# Patient Record
Sex: Female | Born: 1980 | Race: Black or African American | Hispanic: No | Marital: Single | Smoking: Never smoker
Health system: Southern US, Community
[De-identification: ages and names within clinical notes are randomized; demographics above are authoritative.]

## PROBLEM LIST (undated history)

## (undated) DIAGNOSIS — K219 Gastro-esophageal reflux disease without esophagitis: Secondary | ICD-10-CM

## (undated) DIAGNOSIS — K08409 Partial loss of teeth, unspecified cause, unspecified class: Secondary | ICD-10-CM

## (undated) HISTORY — DX: Gastro-esophageal reflux disease without esophagitis: K21.9

## (undated) HISTORY — PX: IRRIGATION AND DEBRIDEMENT SEBACEOUS CYST: SHX5255

---

## 2007-01-29 ENCOUNTER — Ambulatory Visit: Payer: Self-pay

## 2007-05-19 ENCOUNTER — Ambulatory Visit: Payer: Self-pay | Admitting: Internal Medicine

## 2008-04-18 ENCOUNTER — Emergency Department: Payer: Self-pay | Admitting: Emergency Medicine

## 2008-08-24 ENCOUNTER — Emergency Department: Payer: Self-pay | Admitting: Emergency Medicine

## 2008-08-29 ENCOUNTER — Ambulatory Visit: Payer: Self-pay | Admitting: Internal Medicine

## 2011-10-30 ENCOUNTER — Emergency Department: Payer: Self-pay

## 2011-10-30 LAB — URINALYSIS, COMPLETE
Leukocyte Esterase: NEGATIVE
Ph: 5 (ref 4.5–8.0)
Protein: NEGATIVE
RBC,UR: 25 /HPF (ref 0–5)
Specific Gravity: 1.014 (ref 1.003–1.030)
Squamous Epithelial: 2
WBC UR: 1 /HPF (ref 0–5)

## 2011-10-30 LAB — PREGNANCY, URINE: Pregnancy Test, Urine: NEGATIVE m[IU]/mL

## 2012-01-14 ENCOUNTER — Emergency Department: Payer: Self-pay | Admitting: Emergency Medicine

## 2013-01-15 ENCOUNTER — Emergency Department: Payer: Self-pay | Admitting: Emergency Medicine

## 2013-01-15 LAB — URINALYSIS, COMPLETE
Bilirubin,UR: NEGATIVE
Glucose,UR: NEGATIVE mg/dL (ref 0–75)
Ph: 7 (ref 4.5–8.0)
Protein: 25
RBC,UR: 6 /HPF (ref 0–5)
Specific Gravity: 1.01 (ref 1.003–1.030)
Squamous Epithelial: 1

## 2013-01-15 LAB — WET PREP, GENITAL

## 2013-01-15 LAB — BASIC METABOLIC PANEL
Anion Gap: 5 — ABNORMAL LOW (ref 7–16)
BUN: 10 mg/dL (ref 7–18)
Calcium, Total: 8.6 mg/dL (ref 8.5–10.1)
Chloride: 103 mmol/L (ref 98–107)
Co2: 27 mmol/L (ref 21–32)
Creatinine: 0.78 mg/dL (ref 0.60–1.30)
Osmolality: 269 (ref 275–301)
Potassium: 3.5 mmol/L (ref 3.5–5.1)

## 2013-01-15 LAB — CBC
MCH: 29.4 pg (ref 26.0–34.0)
MCHC: 34.7 g/dL (ref 32.0–36.0)
MCV: 85 fL (ref 80–100)
RBC: 4.27 10*6/uL (ref 3.80–5.20)
WBC: 8.2 10*3/uL (ref 3.6–11.0)

## 2013-01-31 ENCOUNTER — Emergency Department: Payer: Self-pay | Admitting: Emergency Medicine

## 2013-01-31 LAB — COMPREHENSIVE METABOLIC PANEL
Albumin: 3.6 g/dL (ref 3.4–5.0)
BUN: 11 mg/dL (ref 7–18)
Bilirubin,Total: 0.4 mg/dL (ref 0.2–1.0)
Calcium, Total: 9.3 mg/dL (ref 8.5–10.1)
Chloride: 106 mmol/L (ref 98–107)
Co2: 23 mmol/L (ref 21–32)
EGFR (African American): >60
EGFR (Non-African Amer.): >60
Potassium: 3.7 mmol/L (ref 3.5–5.1)
SGOT(AST): 20 U/L (ref 15–37)
SGPT (ALT): 24 U/L (ref 12–78)
Total Protein: 7.4 g/dL (ref 6.4–8.2)

## 2013-01-31 LAB — URINALYSIS, COMPLETE
Nitrite: NEGATIVE
Ph: 5 (ref 4.5–8.0)
Protein: 25
RBC,UR: 443 /HPF (ref 0–5)
Squamous Epithelial: 9
WBC UR: 4 /HPF (ref 0–5)

## 2013-01-31 LAB — CBC
HGB: 12.9 g/dL (ref 12.0–16.0)
MCH: 28.8 pg (ref 26.0–34.0)
MCHC: 34 g/dL (ref 32.0–36.0)
MCV: 85 fL (ref 80–100)
Platelet: 257 10*3/uL (ref 150–440)
RBC: 4.49 10*6/uL (ref 3.80–5.20)
WBC: 9.4 10*3/uL (ref 3.6–11.0)

## 2013-02-01 ENCOUNTER — Emergency Department: Payer: Self-pay | Admitting: Emergency Medicine

## 2013-02-01 LAB — HCG, QUANTITATIVE, PREGNANCY: Beta Hcg, Quant.: 53483 m[IU]/mL — ABNORMAL HIGH

## 2013-02-01 LAB — CBC WITH DIFFERENTIAL/PLATELET
Basophil #: 0 10*3/uL (ref 0.0–0.1)
Basophil %: 0.4 %
Eosinophil #: 0 10*3/uL (ref 0.0–0.7)
Eosinophil %: 0.2 %
HCT: 37.1 % (ref 35.0–47.0)
HGB: 12.9 g/dL (ref 12.0–16.0)
Lymphocyte #: 1.8 10*3/uL (ref 1.0–3.6)
MCH: 29.2 pg (ref 26.0–34.0)
MCHC: 34.7 g/dL (ref 32.0–36.0)
Monocyte #: 0.6 x10 3/mm (ref 0.2–0.9)
Monocyte %: 6.9 %
Neutrophil %: 71.5 %
Platelet: 245 10*3/uL (ref 150–440)

## 2013-02-01 LAB — CBC
HCT: 35.8 % (ref 35.0–47.0)
HGB: 12.3 g/dL (ref 12.0–16.0)
MCV: 84 fL (ref 80–100)
Platelet: 236 10*3/uL (ref 150–440)
RBC: 4.24 10*6/uL (ref 3.80–5.20)

## 2013-02-03 LAB — PATHOLOGY REPORT

## 2013-02-10 ENCOUNTER — Observation Stay: Payer: Self-pay

## 2013-02-10 LAB — COMPREHENSIVE METABOLIC PANEL
Albumin: 3.7 g/dL (ref 3.4–5.0)
Alkaline Phosphatase: 69 U/L (ref 50–136)
Anion Gap: 9 (ref 7–16)
BUN: 11 mg/dL (ref 7–18)
Chloride: 100 mmol/L (ref 98–107)
Co2: 25 mmol/L (ref 21–32)
Creatinine: 0.79 mg/dL (ref 0.60–1.30)
EGFR (African American): >60
Glucose: 86 mg/dL (ref 65–99)
SGOT(AST): 16 U/L (ref 15–37)
SGPT (ALT): 30 U/L (ref 12–78)
Sodium: 134 mmol/L — ABNORMAL LOW (ref 136–145)
Total Protein: 7.5 g/dL (ref 6.4–8.2)

## 2013-07-04 ENCOUNTER — Observation Stay: Payer: Self-pay | Admitting: Obstetrics & Gynecology

## 2013-07-04 LAB — URINALYSIS, COMPLETE
BILIRUBIN, UR: NEGATIVE
GLUCOSE, UR: NEGATIVE mg/dL (ref 0–75)
LEUKOCYTE ESTERASE: NEGATIVE
NITRITE: NEGATIVE
PH: 6 (ref 4.5–8.0)
PROTEIN: NEGATIVE
RBC,UR: 45 /HPF (ref 0–5)
SPECIFIC GRAVITY: 1.016 (ref 1.003–1.030)
Squamous Epithelial: 1

## 2013-07-04 LAB — FETAL FIBRONECTIN
Appearance: NORMAL
Fetal Fibronectin: NEGATIVE

## 2013-07-11 ENCOUNTER — Inpatient Hospital Stay: Payer: Self-pay | Admitting: Obstetrics and Gynecology

## 2013-07-11 LAB — ALT: SGPT (ALT): 19 U/L (ref 12–78)

## 2013-07-11 LAB — LIPASE, BLOOD: Lipase: 449 U/L — ABNORMAL HIGH (ref 73–393)

## 2013-07-11 LAB — URINALYSIS, COMPLETE
BILIRUBIN, UR: NEGATIVE
BLOOD: NEGATIVE
GLUCOSE, UR: NEGATIVE mg/dL (ref 0–75)
LEUKOCYTE ESTERASE: NEGATIVE
Nitrite: NEGATIVE
Ph: 7 (ref 4.5–8.0)
Protein: 30
RBC,UR: 4 /HPF (ref 0–5)
Specific Gravity: 1.018 (ref 1.003–1.030)
Squamous Epithelial: 6
WBC UR: 9 /HPF (ref 0–5)

## 2013-07-11 LAB — WET PREP, GENITAL

## 2013-07-11 LAB — SGOT (AST)(ARMC): AST: 36 U/L (ref 15–37)

## 2013-07-11 LAB — ALKALINE PHOSPHATASE: ALK PHOS: 162 U/L — AB

## 2013-07-12 LAB — LIPASE, BLOOD: LIPASE: 120 U/L (ref 73–393)

## 2013-07-14 ENCOUNTER — Ambulatory Visit: Payer: Self-pay

## 2013-07-14 LAB — BETA STREP CULTURE(ARMC)

## 2013-07-19 ENCOUNTER — Encounter: Payer: Self-pay | Admitting: General Surgery

## 2013-08-04 ENCOUNTER — Inpatient Hospital Stay: Payer: Self-pay | Admitting: Obstetrics and Gynecology

## 2013-08-04 ENCOUNTER — Ambulatory Visit: Payer: Self-pay | Admitting: General Surgery

## 2013-08-04 LAB — CBC WITH DIFFERENTIAL/PLATELET
BASOS ABS: 0 10*3/uL (ref 0.0–0.1)
BASOS PCT: 0.2 %
EOS PCT: 0.2 %
Eosinophil #: 0 10*3/uL (ref 0.0–0.7)
HCT: 31.5 % — ABNORMAL LOW (ref 35.0–47.0)
HGB: 10.4 g/dL — ABNORMAL LOW (ref 12.0–16.0)
LYMPHS ABS: 1.3 10*3/uL (ref 1.0–3.6)
LYMPHS PCT: 14.3 %
MCH: 27.1 pg (ref 26.0–34.0)
MCHC: 33 g/dL (ref 32.0–36.0)
MCV: 82 fL (ref 80–100)
MONOS PCT: 4.7 %
Monocyte #: 0.4 x10 3/mm (ref 0.2–0.9)
Neutrophil #: 7.4 10*3/uL — ABNORMAL HIGH (ref 1.4–6.5)
Neutrophil %: 80.6 %
Platelet: 251 10*3/uL (ref 150–440)
RBC: 3.84 10*6/uL (ref 3.80–5.20)
RDW: 16.7 % — ABNORMAL HIGH (ref 11.5–14.5)
WBC: 9.1 10*3/uL (ref 3.6–11.0)

## 2013-08-05 LAB — GC/CHLAMYDIA PROBE AMP

## 2013-08-05 LAB — HEMATOCRIT: HCT: 29.5 % — ABNORMAL LOW (ref 35.0–47.0)

## 2013-09-01 ENCOUNTER — Ambulatory Visit (INDEPENDENT_AMBULATORY_CARE_PROVIDER_SITE_OTHER): Payer: Medicaid Other | Admitting: General Surgery

## 2013-09-01 ENCOUNTER — Encounter: Payer: Self-pay | Admitting: General Surgery

## 2013-09-01 VITALS — BP 142/72 | HR 78 | Resp 14 | Ht 69.0 in | Wt 167.0 lb

## 2013-09-01 DIAGNOSIS — K8020 Calculus of gallbladder without cholecystitis without obstruction: Secondary | ICD-10-CM

## 2013-09-01 NOTE — Progress Notes (Signed)
Patient ID: Paula Page, female   DOB: 18-Jun-1980, 33 y.o.   MRN: [id number]  Chief Complaint  Patient presents with  . Other    New Patient evaluation of gallbladder.     HPI Paula FossaLeslie Castor is a 33 y.o. female who presents for an evaluation of her gallbladder. She states this has been a problem for awhile but wasn't able to pin point exactly what it was. She was pregnant at the time all this began. She had an abdominal ultrasound done on 07/14/13. She complains of abdominal pain that radiates to the back. The pain is located in the right upper quadrant underneath her ribs. She also had nausea and vomiting. Greasy food,dairy products, and vegetables especially lettuce tend to bother her. She states she has had episodes of really bad indigestion that would last for approximately 2 hours. No diarrhea. The patient delivered her third child (344 year old daughter, 33 year old son, infant son) recently.  HPI  Past Medical History  Diagnosis Date  . GERD (gastroesophageal reflux disease)   . Anemia     Past Surgical History  Procedure Laterality Date  . Irrigation and debridement sebaceous cyst      No family history on file.  Social History History  Substance Use Topics  . Smoking status: Never Smoker   . Smokeless tobacco: Not on file  . Alcohol Use: No    Allergies  Allergen Reactions  . Diclegis [Doxylamine-Pyridoxine] Hives  . Penicillins Hives  . Sulfa Antibiotics Hives    Current Outpatient Prescriptions  Medication Sig Dispense Refill  . Iron-FA-B Cmp-C-Biot-Probiotic (FUSION PLUS PO) Take by mouth.      . pantoprazole (PROTONIX) 40 MG tablet Take 40 mg by mouth daily.       No current facility-administered medications for this visit.    Review of Systems Review of Systems  Constitutional: Negative.   Respiratory: Negative.   Cardiovascular: Negative.   Gastrointestinal: Positive for nausea, vomiting and abdominal pain.    Blood pressure 142/72, pulse 78, resp.  rate 14, height 5\' 9"  (1.753 m), weight 167 lb (75.751 kg), last menstrual period 09/01/2013.  Physical Exam Physical Exam  Constitutional: She is oriented to person, place, and time. She appears well-developed and well-nourished.  Neck: Neck supple. No thyromegaly present.  Cardiovascular: Normal rate, regular rhythm and normal heart sounds.   No murmur heard. Pulmonary/Chest: Effort normal and breath sounds normal.  Abdominal: Soft. Normal appearance and bowel sounds are normal. There is no hepatosplenomegaly. There is tenderness in the right upper quadrant and right lower quadrant. No hernia.  Lymphadenopathy:    She has no cervical adenopathy.  Neurological: She is alert and oriented to person, place, and time.  Skin: Skin is warm and dry.    Data Reviewed Abdominal ultrasound dated July 14, 2013 showed evidence of cholelithiasis without evidence of acute cholecystitis. Common bile duct measured 1.3 mm.  Assessment     Chronic cholecystitis and cholelithiasis.     Plan    The pros and cons elective cholecystectomy were reviewed. The risks of the procedure including those related to bleeding, infection, common bile duct injury and the possibility of an open procedure were discussed.      [facility]. She will pre admit by phone on 09/16/13. She is aware of date and all instructions.  Ref. MD: Sharen HonesGUTIERREZ, COLLEEN/ Dr. Corena PilgrimHarris     Nicki Furlan W Joselynn Amoroso 09/02/2013, 8:03 PM

## 2013-09-01 NOTE — Patient Instructions (Addendum)
Laparoscopic Cholecystectomy Laparoscopic cholecystectomy is surgery to remove the gallbladder. The gallbladder is located in the upper right part of the abdomen, behind the liver. It is a storage sac for bile produced in the liver. Bile aids in the digestion and absorption of fats. Cholecystectomy is often done for inflammation of the gallbladder (cholecystitis). This condition is usually caused by a buildup of gallstones (cholelithiasis) in your gallbladder. Gallstones can block the flow of bile, resulting in inflammation and pain. In severe cases, emergency surgery may be required. When emergency surgery is not required, you will have time to prepare for the procedure. Laparoscopic surgery is an alternative to open surgery. Laparoscopic surgery has a shorter recovery time. Your common bile duct may also need to be examined during the procedure. If stones are found in the common bile duct, they may be removed. LET [facility] PROVIDER KNOW ABOUT:  Any allergies you have.  All medicines you are taking, including vitamins, herbs, eye drops, creams, and over-the-counter medicines.  Previous problems you or members of your family have had with the use of anesthetics.  Any blood disorders you have.  Previous surgeries you have had.  Medical conditions you have. RISKS AND COMPLICATIONS Generally, this is a safe procedure. However, as with any procedure, complications can occur. Possible complications include:  Infection.  Damage to the common bile duct, nerves, arteries, veins, or other internal organs such as the stomach, liver, or intestines.  Bleeding.  A stone may remain in the common bile duct.  A bile leak from the cyst duct that is clipped when your gallbladder is removed.  The need to convert to open surgery, which requires a larger incision in the abdomen. This may be necessary if your surgeon thinks it is not safe to continue with a laparoscopic procedure. BEFORE THE  PROCEDURE  Ask your health care provider about changing or stopping any regular medicines. You will need to stop taking aspirin or blood thinners at least 5 days prior to surgery.  Do not eat or drink anything after midnight the night before surgery.  Let your health care provider know if you develop a cold or other infectious problem before surgery. PROCEDURE   You will be given medicine to make you sleep through the procedure (general anesthetic). A breathing tube will be placed in your mouth.  When you are asleep, your surgeon will make several small cuts (incisions) in your abdomen.  A thin, lighted tube with a tiny camera on the end (laparoscope) is inserted through one of the small incisions. The camera on the laparoscope sends a picture to a TV screen in the operating room. This gives the surgeon a good view inside your abdomen.  A gas will be pumped into your abdomen. This expands your abdomen so that the surgeon has more room to perform the surgery.  Other tools needed for the procedure are inserted through the other incisions. The gallbladder is removed through one of the incisions.  After the removal of your gallbladder, the incisions will be closed with stitches, staples, or skin glue. AFTER THE PROCEDURE  You will be taken to a recovery area where your progress will be checked often.  You may be allowed to go home the same day if your pain is controlled and you can tolerate liquids. Document Released: 04/29/2005 Document Revised: 02/17/2013 Document Reviewed: 12/09/2012 [facility] Patient Information 2014 [employer] [place].   Patient [facility]. She will pre admit by phone  on 09/16/13. She is aware of date and all instructions.

## 2013-09-02 ENCOUNTER — Other Ambulatory Visit: Payer: Self-pay | Admitting: General Surgery

## 2013-09-02 DIAGNOSIS — K8020 Calculus of gallbladder without cholecystitis without obstruction: Secondary | ICD-10-CM

## 2013-09-03 ENCOUNTER — Emergency Department: Payer: Self-pay | Admitting: Emergency Medicine

## 2013-09-03 LAB — URINALYSIS, COMPLETE
BILIRUBIN, UR: NEGATIVE
Bacteria: NONE SEEN
Glucose,UR: NEGATIVE mg/dL (ref 0–75)
Ketone: NEGATIVE
Leukocyte Esterase: NEGATIVE
Nitrite: NEGATIVE
Ph: 5 (ref 4.5–8.0)
Protein: NEGATIVE
RBC,UR: 7 /HPF (ref 0–5)
SPECIFIC GRAVITY: 1.019 (ref 1.003–1.030)
WBC UR: 1 /HPF (ref 0–5)

## 2013-09-03 LAB — COMPREHENSIVE METABOLIC PANEL
ALK PHOS: 105 U/L
Albumin: 3.7 g/dL (ref 3.4–5.0)
Anion Gap: 4 — ABNORMAL LOW (ref 7–16)
BUN: 11 mg/dL (ref 7–18)
Bilirubin,Total: 0.4 mg/dL (ref 0.2–1.0)
Calcium, Total: 8.8 mg/dL (ref 8.5–10.1)
Chloride: 110 mmol/L — ABNORMAL HIGH (ref 98–107)
Co2: 27 mmol/L (ref 21–32)
Creatinine: 0.93 mg/dL (ref 0.60–1.30)
EGFR (Non-African Amer.): >60
GLUCOSE: 107 mg/dL — AB (ref 65–99)
OSMOLALITY: 281 (ref 275–301)
Potassium: 3.6 mmol/L (ref 3.5–5.1)
SGOT(AST): 20 U/L (ref 15–37)
SGPT (ALT): 18 U/L (ref 12–78)
SODIUM: 141 mmol/L (ref 136–145)
TOTAL PROTEIN: 7.6 g/dL (ref 6.4–8.2)

## 2013-09-03 LAB — CBC WITH DIFFERENTIAL/PLATELET
BASOS PCT: 0.4 %
Basophil #: 0 10*3/uL (ref 0.0–0.1)
EOS ABS: 0 10*3/uL (ref 0.0–0.7)
Eosinophil %: 0.9 %
HCT: 40.6 % (ref 35.0–47.0)
HGB: 13 g/dL (ref 12.0–16.0)
LYMPHS ABS: 2 10*3/uL (ref 1.0–3.6)
LYMPHS PCT: 36.4 %
MCH: 26.8 pg (ref 26.0–34.0)
MCHC: 32.1 g/dL (ref 32.0–36.0)
MCV: 83 fL (ref 80–100)
MONOS PCT: 7.2 %
Monocyte #: 0.4 x10 3/mm (ref 0.2–0.9)
NEUTROS ABS: 3 10*3/uL (ref 1.4–6.5)
Neutrophil %: 55.1 %
Platelet: 236 10*3/uL (ref 150–440)
RBC: 4.86 10*6/uL (ref 3.80–5.20)
RDW: 19 % — AB (ref 11.5–14.5)
WBC: 5.4 10*3/uL (ref 3.6–11.0)

## 2013-09-03 LAB — LIPASE, BLOOD: Lipase: 227 U/L (ref 73–393)

## 2013-09-23 ENCOUNTER — Encounter: Payer: Self-pay | Admitting: General Surgery

## 2013-09-23 ENCOUNTER — Ambulatory Visit: Payer: Self-pay | Admitting: General Surgery

## 2013-09-23 DIAGNOSIS — K8010 Calculus of gallbladder with chronic cholecystitis without obstruction: Secondary | ICD-10-CM

## 2013-09-23 HISTORY — PX: CHOLECYSTECTOMY: SHX55

## 2013-09-24 LAB — PATHOLOGY REPORT

## 2013-09-27 ENCOUNTER — Encounter: Payer: Self-pay | Admitting: General Surgery

## 2013-09-30 ENCOUNTER — Ambulatory Visit (INDEPENDENT_AMBULATORY_CARE_PROVIDER_SITE_OTHER): Payer: Self-pay | Admitting: General Surgery

## 2013-09-30 ENCOUNTER — Encounter: Payer: Self-pay | Admitting: General Surgery

## 2013-09-30 VITALS — BP 112/74 | HR 72 | Resp 12 | Ht 69.0 in | Wt 169.0 lb

## 2013-09-30 DIAGNOSIS — K8020 Calculus of gallbladder without cholecystitis without obstruction: Secondary | ICD-10-CM

## 2013-09-30 NOTE — Patient Instructions (Signed)
Patient to return as needed. Proper lifting techniques reviewed. 

## 2013-09-30 NOTE — Progress Notes (Signed)
Patient ID: Paula Page, female   DOB: December 07, 1980, 33 y.o.   MRN: [id number]  Chief Complaint  Patient presents with  . Routine Post Op    gallbladder    HPI Paula Page is a 33 y.o. female here today for her post op gallbladder surgery .she states she is doing well just a little sore still and indigestion. The patient reports a moderate amount of pain post procedure as well as increasing indigestion. She did not obtain significant benefit from the use of a heating pad. She's make use of Paula Page for her indigestion with good relief. No change in bowel habits. It is only now that her appetite is returning to normal.  The patient reported discomfort when her child would lay on the upper abdomen to nurse or when she would stretch her arms over her head. She reported discomfort at the base of the xiphoid process just above the level of the epigastric incision. She was reassured that this will resolve in time. HPI  Past Medical History  Diagnosis Date  . GERD (gastroesophageal reflux disease)   . Anemia     Past Surgical History  Procedure Laterality Date  . Irrigation and debridement sebaceous cyst    . Cholecystectomy  09/23/13    No family history on file.  Social History History  Substance Use Topics  . Smoking status: Never Smoker   . Smokeless tobacco: Never Used  . Alcohol Use: No    Allergies  Allergen Reactions  . Diclegis [Doxylamine-Pyridoxine] Hives  . Penicillins Hives  . Sulfa Antibiotics Hives    Current Outpatient Prescriptions  Medication Sig Dispense Refill  . Iron-FA-B Cmp-C-Biot-Probiotic (FUSION PLUS PO) Take by mouth.      . pantoprazole (PROTONIX) 40 MG tablet Take 40 mg by mouth daily.       No current facility-administered medications for this visit.    Review of Systems Review of Systems  Constitutional: Negative.   Respiratory: Negative.   Cardiovascular: Negative.     Blood pressure 112/74, pulse 72, resp. rate 12, height 5\' 9"  (1.753 m),  weight 169 lb (76.658 kg), last menstrual period 09/01/2013.  Physical Exam Physical Exam  Constitutional: She is oriented to person, place, and time. She appears well-developed and well-nourished.  Eyes: Conjunctivae are normal.  Neck: Neck supple.  Cardiovascular: Normal rate, regular rhythm and normal heart sounds.   Pulmonary/Chest: Effort normal and breath sounds normal.  Abdominal: Soft. Normal appearance and bowel sounds are normal. There is no tenderness.  Port site looks clean and healing well.   Neurological: She is alert and oriented to person, place, and time.  Skin: Skin is warm and dry.    Data Reviewed Chronic cholecystitis and cholelithiasis   Assessment    Steady progress status post cholecystectomy.    Plan    The patient will increase her activities as tolerated. Followup ear will be on an as-needed basis.    PCP: No Pcp Per Patient    Earline MayotteJeffrey W Byrnett 09/30/2013, 1:55 PM

## 2014-03-14 ENCOUNTER — Encounter: Payer: Self-pay | Admitting: General Surgery

## 2014-09-03 NOTE — Op Note (Signed)
PATIENT NAME:  Paula CobbsBROWN, Paula Page MR#:  [id number] DATE OF BIRTH:  [birth year]  DATE OF PROCEDURE:  09/23/2013  PREOPERATIVE DIAGNOSIS: Chronic cholecystitis and cholelithiasis.   POSTOPERATIVE DIAGNOSIS: Chronis cholecystitis and cholelithiasis.  OPERATIVE PROCEDURE: Laparoscopic cholecystectomy with intraoperative cholangiograms.   SURGEON: Donnalee CurryJeffrey Meyah Corle, MD.   ANESTHESIA: General endotracheal under Dr. Noralyn Pickarroll.   ESTIMATED BLOOD LOSS: Less than 5 mL.   CLINICAL NOTE: This 34 year old woman has had episodic abdominal pain and ultrasound showed evidence of cholelithiasis. She was felt to be a candidate for elective cholecystectomy.   OPERATIVE NOTE: With the patient under adequate general endotracheal anesthesia, the abdomen was prepped with ChloraPrep and draped. In Trendelenburg position, a Veress needle was placed through a transumbilical incision. After assuring intra-abdominal location with the hanging drop test, the abdomen was insufflated with CO2 at 10 mmHg pressure. A 10 mm step port was expanded and inspection showed no evidence of injury from initial port placement. The patient was placed in reverse Trendelenburg position and rolled to the left. An 11 mm Xcel port was placed in the epigastrium and two 5 mm step ports placed in the right lateral abdominal wall. There were noted to be multiple fine adhesions between the omentum and the entire undersurface of the gallbladder. These were taken down with cautery dissection. The gallbladder was placed on cephalad traction and the neck of the gallbladder cleared. Cystic duct overlay the cystic artery and cystic duct lymph node. After clearing the cystic duct, a Kumar clamp was placed. Initial attempts at cholangiograms were unsuccessful and the catheter was repositioned. Using a total of 30 mL of one half strength Conray, cholangiograms were completed. There was a suggestion of a round filling defect in the proximal common hepatic duct and on  further dye instillation and rolling the patient to the right, this resolved suggesting this was an air bubble. There was free flow into the duodenum. Normal taper of the distal common bile duct. The cystic duct was doubly clipped as were both branches of the cystic artery adjacent to the gallbladder. The gallbladder was removed from the liver bed making use of hook cautery dissection. A small rent in the gallbladder released 2 small pearly white cholesterol stones. These were retrieved. The defect in the gallbladder was grasped and the remaining dissection completed. The gallbladder was delivered to an Endo Catch bag and then through the umbilical port site. After re-establishing pneumoperitoneum, inspection from the epigastric site showed no evidence of injury from initial port placement. The right upper quadrant was irrigated with lactated Ringer's solution. Good hemostasis was noted. Clips were appropriately placed. The abdomen was then desufflated and ports removed under direct vision. The fascia at the umbilicus and the epigastric site was approximated with a single 0 Vicryl suture. Skin incisions were closed with 4-0 Vicryl subcuticular sutures. Benzoin, Steri-Strips, Telfa and Tegaderm dressings were applied. The patient tolerated the procedure well and was taken to the recovery room in stable condition.   ____________________________ Paula MayotteJeffrey W. Davianna Deutschman, MD jwb:aw D: 09/23/2013 10:10:02 ET T: 09/23/2013 10:19:57 ET JOB#: [id number]  cc: Paula MayotteJeffrey W. Argel Pablo, MD, <Dictator> Paula CodeFozia Page. Khan, MD Paula Fiske Brion AlimentW Ceria Suminski MD ELECTRONICALLY SIGNED 09/23/2013 12:43

## 2014-09-20 NOTE — H&P (Signed)
L&D Evaluation:  History Expanded:  HPI 34 yo at 5333 6/[redacted] weeks EGA with some contractions and mild pains.  [facility].  No vaginal bleeding or ROM.  Good FM.   Patient's Medical History No Chronic Illness   Patient's Surgical History none   Medications Pre Natal Vitamins   Allergies PCN, Sulfa   Social History none   Family History Non-Contributory   ROS:  ROS All systems were reviewed.  HEENT, CNS, GI, GU, Respiratory, CV, Renal and Musculoskeletal systems were found to be normal.   Exam:  Vital Signs stable   General no apparent distress   Mental Status clear   Chest clear   Heart normal sinus rhythm   Abdomen gravid, non-tender   Estimated Fetal Weight Average for gestational age   Back no CVAT   Edema no edema   Pelvic no external lesions, cervix closed and thick   Mebranes Intact   FHT normal rate with no decels   Ucx irregular   Skin dry   Other fetal fibronectin NEG   Impression:  Impression Eval for Preterm Labor.  Cervix not dilated on multiple exams.   Plan:  Plan EFM/NST, monitor contractions and for cervical change   Comments Procardia, then Terbutaline to try to alleviate contractions. MSO4 once gv to help her rest. AFV.   Follow Up Appointment already scheduled   Electronic Signatures: Letitia LibraHarris, Travarus Trudo Paul (MD)  (Signed 22-Feb-15 13:43)  Authored: L&D Evaluation   Last Updated: 22-Feb-15 13:43 by Letitia LibraHarris, Sheylin Scharnhorst Paul (MD)

## 2014-09-20 NOTE — H&P (Signed)
L&D Evaluation:  History Expanded:  HPI 34 yo at 6838 1/[redacted] weeks gestational age.  Her pregnancy has been complicated by early admission for hyperemesis and Preterm Labor.  Now w LOF since 0800, mild contractions although she has been having reg mild contractions for weeks, a little worse today. [facility].  No vaginal bleeding.  Good FM. O-, received RhoGam, RI, VI, TDaP UTD, Group B Beta Strep. -.   Gravida 4   Term 2   PreTerm 0   Abortion 1   Living 2   Blood Type (Maternal) O negative   Group B Strep Results Maternal (Result >5wks must be treated as unknown) negative   Maternal HIV Negative   Maternal Syphilis Ab Nonreactive   Maternal Varicella Immune   Rubella Results (Maternal) immune   EDC 16-Aug-2013   Presents with leaking fluid   Patient's Medical History No Chronic Illness   Patient's Surgical History none   Medications Pre Natal Vitamins   Allergies PCN, Sulfa   Social History none   Family History Non-Contributory   ROS:  ROS All systems were reviewed.  HEENT, CNS, GI, GU, Respiratory, CV, Renal and Musculoskeletal systems were found to be normal., unless otherwise noted in HPI   Exam:  Vital Signs stable   General no apparent distress   Mental Status clear   Chest clear   Heart normal sinus rhythm   Abdomen gravid, tender with contractions   Estimated Fetal Weight Average for gestational age   Back no CVAT   Edema no edema   Pelvic no external lesions, 1/70/-2, may be bulging bag as well in spite of positivity of Spontaneous rupture of membranes   Mebranes Ruptured, + pool, - nitrazine, + fern   Description clear   FHT normal rate with no decels   FHT Description 140/mod var/+accels/no decels   Ucx regular, 4 q 10 min   Ucx Frequency 2 min   Skin dry   Impression:  Impression Eval for Preterm Labor.  Cervix not dilated on multiple exams.   Plan:  Plan EFM/NST, monitor contractions and  for cervical change   Comments spontaneous rupture of membranes so monitor for labor (since 0800).  Pitocin as needed.  Epidural discussed.   Electronic Signatures: Letitia LibraHarris, Robert Paul (MD)  (Signed 25-Mar-15 15:16)  Authored: L&D Evaluation   Last Updated: 25-Mar-15 15:16 by Letitia LibraHarris, Robert Paul (MD)

## 2014-09-20 NOTE — H&P (Signed)
L&D Evaluation:  History Expanded:  HPI 34 yo at 8234 6/[redacted] weeks gestational age.  Her pregnancy has been complicated by early admission for hyperemesis.  She presented one week ago with contractions, but no cervical change (she had a negative fFN). She was started on nifedipine 30mg  xl daily, which she is taking. She presents today with general abdominal and back complaints.  She ate lunch at 4pm and became nauseated and threw up on about 6pm.  She had epigastric pain and lower abdominal/suprapubic pain since then.  She has also had right sided pain and diffuse back pain that comes and goes.  She has had occasional nausea, but only had emesis one time.  She tried taking protonix and phenergan at home and her epigastric pain has improved. She now has lower abdominal pain/suprapubic pain.  She denies fevers. She has had urinary frequency and pressure.  She has had some clear vaginal discharge.  [facility]  No vaginal bleeding or ROM.  Good FM.  + contractions.   Gravida 4   Term 2   PreTerm 0   Abortion 1   Living 2   Blood Type (Maternal) O negative   Group B Strep Results Maternal (Result >5wks must be treated as unknown) unknown/result > 5 weeks ago   Maternal HIV Negative   Maternal Syphilis Ab Nonreactive   Maternal Varicella Immune   Rubella Results (Maternal) immune   [facility]   Patient's Medical History No Chronic Illness   Patient's Surgical History none   Medications Pre Natal Vitamins  nifedipine XL 30mg  once PO daily   Allergies PCN, Sulfa   Social History none   Family History Non-Contributory   ROS:  ROS All systems were reviewed.  HEENT, CNS, GI, GU, Respiratory, CV, Renal and Musculoskeletal systems were found to be normal., unless otherwise noted in HPI   Exam:  Vital Signs stable  T 97.9, P90-107, BP 94-112/56-69, RR16   General mild-mod distress   Mental Status clear   Chest clear   Heart normal sinus rhythm    Abdomen gravid, ttp with contractions at fundus, ttp in suprapubic area   Estimated Fetal Weight Average for gestational age   Back no CVAT   Edema no edema   Pelvic no external lesions, cervix closed and thick   Mebranes Intact   FHT normal rate with no decels   FHT Description 140/mod var/+accels/no decels   Ucx regular, 4 q 10 min   Skin dry   Impression:  Impression Eval for Preterm Labor.  Cervix not dilated on multiple exams.   Plan:  Plan EFM/NST, monitor contractions and for cervical change   Comments 1) abdominal pain: no evidnece of labor with closed cervix.  She is having contractions.  May give a single dose of terb to see if relieves pain if no other etiology found.  Possible gastroenteritis. Will give a dose of bicitra. Will give 1 liter of IV fluid. Will send LFTs and lipase. 2) contractions: Send U/A and wet prep.  Send GBS in case labors early.   Labs:  Lab Results:  Routine Micro:  01-Mar-15 22:30   Micro Text Report WET PREP   COMMENT                   MODERATE WHITE BLOOD CELLS SEEN   COMMENT                   NO CLUE CELLS SEEN  COMMENT                   NO TRICHOMONAS SEEN   COMMENT                   NO YEAST SEEN   COMMENT                   NO SPERMATOZOA SEEN   ANTIBIOTIC                       Comment 1. MODERATE WHITE BLOOD CELLS SEEN  Comment 2. NO CLUE CELLS SEEN  Comment 3. NO TRICHOMONAS SEEN  Comment 4. NO YEAST SEEN  Comment 5. NO SPERMATOZOA SEEN  Result(s) reported on 11 Jul 2013 at 10:50PM.  Routine UA:  01-Mar-15 21:41   Color (UA) Yellow  Clarity (UA) Cloudy  Glucose (UA) Negative  Bilirubin (UA) Negative  Ketones (UA) 2+  Specific Gravity (UA) 1.018  Blood (UA) Negative  pH (UA) 7.0  Protein (UA) 30 mg/dL  Nitrite (UA) Negative  Leukocyte Esterase (UA) Negative (Result(s) reported on 11 Jul 2013 at 10:30PM.)  RBC (UA) 4 /HPF  WBC (UA) 9 /HPF  Bacteria (UA) 1+  Epithelial Cells (UA) 6 /HPF  Mucous (UA) PRESENT  (Result(s) reported on 11 Jul 2013 at 10:30PM.)   Electronic Signatures: Conard NovakJackson, Deavion Dobbs D (MD)  (Signed 01-Mar-15 23:25)  Authored: L&D Evaluation, Labs   Last Updated: 01-Mar-15 23:25 by Conard NovakJackson, Kyshaun Barnette D (MD)

## 2014-12-20 ENCOUNTER — Emergency Department: Payer: PRIVATE HEALTH INSURANCE

## 2014-12-20 ENCOUNTER — Encounter: Payer: Self-pay | Admitting: Urgent Care

## 2014-12-20 ENCOUNTER — Emergency Department
Admission: EM | Admit: 2014-12-20 | Discharge: 2014-12-20 | Disposition: A | Discharge status: Home / Self Care | Payer: PRIVATE HEALTH INSURANCE | Attending: Emergency Medicine | Admitting: Emergency Medicine

## 2014-12-20 DIAGNOSIS — Z79899 Other long term (current) drug therapy: Secondary | ICD-10-CM | POA: Insufficient documentation

## 2014-12-20 DIAGNOSIS — Y9289 Other specified places as the place of occurrence of the external cause: Secondary | ICD-10-CM | POA: Diagnosis not present

## 2014-12-20 DIAGNOSIS — Y998 Other external cause status: Secondary | ICD-10-CM | POA: Diagnosis not present

## 2014-12-20 DIAGNOSIS — Y9389 Activity, other specified: Secondary | ICD-10-CM | POA: Diagnosis not present

## 2014-12-20 DIAGNOSIS — Z880 Allergy status to penicillin: Secondary | ICD-10-CM | POA: Diagnosis not present

## 2014-12-20 DIAGNOSIS — S99912A Unspecified injury of left ankle, initial encounter: Secondary | ICD-10-CM | POA: Diagnosis present

## 2014-12-20 DIAGNOSIS — W1839XA Other fall on same level, initial encounter: Secondary | ICD-10-CM | POA: Diagnosis not present

## 2014-12-20 DIAGNOSIS — S93402A Sprain of unspecified ligament of left ankle, initial encounter: Secondary | ICD-10-CM | POA: Diagnosis not present

## 2014-12-20 MED ORDER — OXYCODONE-ACETAMINOPHEN 5-325 MG PO TABS: 1.0000 | ORAL_TABLET | ORAL | Status: DC | PRN

## 2014-12-20 NOTE — ED Notes (Signed)
Patient presents with c/o LEFT ankle pain. Patient reports that foot fell asleep and she stood up with her child and stumbled causing an inversion injury of her ankle. (+) PMS noted; cap refill WNL; foot warm and dry.

## 2014-12-20 NOTE — ED Notes (Signed)

## 2014-12-20 NOTE — ED Provider Notes (Signed)
[facility] Emergency Department Provider Note ____________________________________________  Time seen: Approximately 9:40 PM  I have reviewed the triage vital signs and the nursing notes.   HISTORY  Chief Complaint Ankle Pain   HPI Paula Page is a 34 y.o. female presenting to the emergency room with complaint of left ankle pain.  She states she went to stand up but her foot was asleep and so she rolled her ankle while carrying her child.  She was more focused on not dropping her child and therefore was not focused on trying to stop her foot from rolling to the side.The pain is currently 8/10 and she is unable to bear weight.  The pain is located over the lateral malleolus and lateral foot.  She has full ROM though painful.  Cap refill normal and swelling is minimal.   Past Medical History  Diagnosis Date  . GERD (gastroesophageal reflux disease)     Patient Active Problem List   Diagnosis Date Noted  . Gallstones 09/02/2013    Past Surgical History  Procedure Laterality Date  . Irrigation and debridement sebaceous cyst    . Cholecystectomy  09/23/13    Current Outpatient Rx  Name  Route  Sig  Dispense  Refill  . Iron-FA-B Cmp-C-Biot-Probiotic (FUSION PLUS PO)   Oral   Take by mouth.         . oxyCODONE-acetaminophen (ROXICET) 5-325 MG per tablet   Oral   Take 1-2 tablets by mouth every 4 (four) hours as needed for severe pain.   15 tablet   0   . pantoprazole (PROTONIX) 40 MG tablet   Oral   Take 40 mg by mouth daily.           Allergies Diclegis; Penicillins; and Sulfa antibiotics  No family history on file.  Social History History  Substance Use Topics  . Smoking status: Never Smoker   . Smokeless tobacco: Never Used  . Alcohol Use: No    Review of Systems Constitutional: No fever/chills Cardiovascular: Denies chest pain. Respiratory: Denies shortness of breath. Musculoskeletal: Positive for left ankle pain. Skin:  Negative for rash. Neurological: Negative for headaches, focal weakness or numbness.  10-point ROS otherwise negative.  ____________________________________________   PHYSICAL EXAM:  VITAL SIGNS: ED Triage Vitals  Enc Vitals Group     BP 12/20/14 2112 121/72 mmHg     Pulse Rate 12/20/14 2112 76     Resp 12/20/14 2112 16     Temp 12/20/14 2112 98.4 F (36.9 C)     Temp Source 12/20/14 2112 Oral     SpO2 12/20/14 2112 100 %     Weight 12/20/14 2112 165 lb (74.844 kg)     Height 12/20/14 2112  (1.753 m)     Head Cir --      Peak Flow --      Pain Score 12/20/14 2113 8     Pain Loc --      Pain Edu? --      Excl. in GC? --     Constitutional: Alert and oriented. Well appearing and in no acute distress. Cardiovascular: Normal rate, regular rhythm. Grossly normal heart sounds.  Good peripheral circulation. Respiratory: Normal respiratory effort.  No retractions. Lungs CTAB. Musculoskeletal: Tenderness over the left lateral malleolus and lateral 5th metatarsal. Full ROM noted with discomfort. Unable to bear weight. Neurologic:  Normal speech and language. No gross focal neurologic deficits are appreciated. No gait instability. Skin:  Skin is warm,  dry and intact. No rash noted. Psychiatric: Mood and affect are normal. Speech and behavior are normal.  ____________________________________________   LABS (all labs ordered are listed, but only abnormal results are displayed)  Labs Reviewed - No data to display ____________________________________________  RADIOLOGY  DG ankle complete left EXAM: LEFT ANKLE COMPLETE - 3+ VIEW  COMPARISON: None.  FINDINGS: Three views of left ankle submitted. No acute fracture or subluxation. Ankle mortise is preserved.  IMPRESSION: Negative ____________________________________________   PROCEDURES  Procedure(s) performed: None  Critical Care performed: No  ____________________________________________   INITIAL  IMPRESSION / ASSESSMENT AND PLAN / ED COURSE  Pertinent labs & imaging results that were available during my care of the patient were reviewed by me and considered in my medical decision making (see chart for details).  Patient presents to the emergency room with left ankle pain after inversion injury to the ankle.  Radiographs of the left ankle are negative.  Ace wrap and ankle splint applied for support.  Instructions on ankle sprain care discussed. Percocet rx given for pain.  Follow up with Ortho if symptoms do not improve. ____________________________________________   FINAL CLINICAL IMPRESSION(S) / ED DIAGNOSES  Final diagnoses:  Left ankle sprain, initial encounter     Evangeline Dakin, PA-C 12/21/14 0004  Sharyn Creamer, MD 12/23/14 1550

## 2014-12-20 NOTE — Discharge Instructions (Signed)

## 2014-12-20 NOTE — ED Notes (Addendum)
Patient present to ED with left sided ankle pain, reports fell on ankle and states "it twisted inward" at 7:40 pm. Reports she was holding her baby when she fell. Patient reports (+) sensation and movement, reports feels tingling sensation and numbness in foot. Good pedal pulse and capillary refill. Patient unable to place weight on foot. No obvious deformity or swelling noted. Patient alert and oriented x 4, respirations even and unlabored.

## 2014-12-30 ENCOUNTER — Ambulatory Visit
Admission: RE | Admit: 2014-12-30 | Discharge: 2014-12-30 | Disposition: A | Discharge status: Home / Self Care | Payer: PRIVATE HEALTH INSURANCE | Source: Ambulatory Visit | Attending: Physician Assistant | Admitting: Physician Assistant

## 2014-12-30 ENCOUNTER — Other Ambulatory Visit: Payer: Self-pay | Admitting: Physician Assistant

## 2014-12-30 DIAGNOSIS — S99922A Unspecified injury of left foot, initial encounter: Secondary | ICD-10-CM

## 2014-12-30 DIAGNOSIS — M79672 Pain in left foot: Secondary | ICD-10-CM | POA: Diagnosis not present

## 2015-10-05 ENCOUNTER — Encounter: Payer: Self-pay | Admitting: Physician Assistant

## 2015-10-05 ENCOUNTER — Ambulatory Visit: Payer: Self-pay | Admitting: Physician Assistant

## 2015-10-05 VITALS — BP 119/70 | HR 80 | Temp 98.7°F

## 2015-10-05 DIAGNOSIS — R1010 Upper abdominal pain, unspecified: Secondary | ICD-10-CM

## 2015-10-05 LAB — POCT URINALYSIS DIPSTICK
BILIRUBIN UA: NEGATIVE
GLUCOSE UA: NEGATIVE
KETONES UA: NEGATIVE
LEUKOCYTES UA: NEGATIVE
Nitrite, UA: NEGATIVE
PH UA: 6.5
Protein, UA: NEGATIVE
Spec Grav, UA: 1.02
Urobilinogen, UA: 1

## 2015-10-05 LAB — POCT URINE PREGNANCY: Preg Test, Ur: NEGATIVE

## 2015-10-05 MED ORDER — PROMETHAZINE HCL 25 MG PO TABS: 25.0000 mg | ORAL_TABLET | Freq: Three times a day (TID) | ORAL | Status: DC | PRN

## 2015-10-05 MED ORDER — IBUPROFEN 800 MG PO TABS: 800.0000 mg | ORAL_TABLET | Freq: Three times a day (TID) | ORAL | Status: DC | PRN

## 2015-10-05 NOTE — Addendum Note (Signed)
Addended by: Catha BrowEACON, MONIQUE T on: 10/05/2015 02:31 PM   Modules accepted: Orders

## 2015-10-05 NOTE — Progress Notes (Signed)
S: c/o midepigastric pain, hx of same since she had her gallbladder out, if she eats something that she doesn't normally eat it gets worse, no fever/chills, + nausea and r sided back pain, no v/d, sx for a few days, feels a little better today than she did yesterday  O: vitals wnl, nad, lungs c t a, cv rrr, no cva tenderness, abd soft mildly tender in midepigastric area, bs normal all 4 quads, n/v intact, ua w 2+ blood  A: abdominal pain  P: phenergan, ibuprofen, f/u with GI if sx return, go to ER if worsening

## 2015-10-06 LAB — COMPREHENSIVE METABOLIC PANEL
ALK PHOS: 66 IU/L (ref 39–117)
ALT: 11 IU/L (ref 0–32)
AST: 16 IU/L (ref 0–40)
Albumin/Globulin Ratio: 1.5 (ref 1.2–2.2)
Albumin: 4.1 g/dL (ref 3.5–5.5)
BILIRUBIN TOTAL: 0.4 mg/dL (ref 0.0–1.2)
BUN/Creatinine Ratio: 14 (ref 9–23)
BUN: 10 mg/dL (ref 6–20)
CHLORIDE: 99 mmol/L (ref 96–106)
CO2: 23 mmol/L (ref 18–29)
Calcium: 8.5 mg/dL — ABNORMAL LOW (ref 8.7–10.2)
Creatinine, Ser: 0.71 mg/dL (ref 0.57–1.00)
GFR calc Af Amer: 128 mL/min/{1.73_m2} (ref >59)
GFR calc non Af Amer: 111 mL/min/{1.73_m2} (ref >59)
Globulin, Total: 2.7 g/dL (ref 1.5–4.5)
Glucose: 90 mg/dL (ref 65–99)
Potassium: 3.7 mmol/L (ref 3.5–5.2)
Sodium: 139 mmol/L (ref 134–144)
TOTAL PROTEIN: 6.8 g/dL (ref 6.0–8.5)

## 2015-10-06 LAB — LIPASE: LIPASE: 17 U/L (ref 0–59)

## 2015-10-15 ENCOUNTER — Emergency Department: Payer: Managed Care, Other (non HMO)

## 2015-10-15 ENCOUNTER — Emergency Department
Admission: EM | Admit: 2015-10-15 | Discharge: 2015-10-15 | Disposition: A | Discharge status: Home / Self Care | Payer: Managed Care, Other (non HMO) | Attending: Emergency Medicine | Admitting: Emergency Medicine

## 2015-10-15 ENCOUNTER — Encounter: Payer: Self-pay | Admitting: Emergency Medicine

## 2015-10-15 DIAGNOSIS — R1011 Right upper quadrant pain: Secondary | ICD-10-CM | POA: Diagnosis present

## 2015-10-15 DIAGNOSIS — N390 Urinary tract infection, site not specified: Secondary | ICD-10-CM | POA: Insufficient documentation

## 2015-10-15 DIAGNOSIS — R109 Unspecified abdominal pain: Secondary | ICD-10-CM

## 2015-10-15 LAB — COMPREHENSIVE METABOLIC PANEL
ALK PHOS: 55 U/L (ref 38–126)
ALT: 16 U/L (ref 14–54)
AST: 22 U/L (ref 15–41)
Albumin: 4.1 g/dL (ref 3.5–5.0)
Anion gap: 6 (ref 5–15)
BUN: 15 mg/dL (ref 6–20)
CO2: 25 mmol/L (ref 22–32)
CREATININE: 0.88 mg/dL (ref 0.44–1.00)
Calcium: 8.8 mg/dL — ABNORMAL LOW (ref 8.9–10.3)
Chloride: 108 mmol/L (ref 101–111)
GFR calc Af Amer: >60 mL/min (ref >60)
GFR calc non Af Amer: >60 mL/min (ref >60)
Glucose, Bld: 91 mg/dL (ref 65–99)
Potassium: 3.9 mmol/L (ref 3.5–5.1)
Sodium: 139 mmol/L (ref 135–145)
Total Bilirubin: 0.2 mg/dL — ABNORMAL LOW (ref 0.3–1.2)
Total Protein: 7.1 g/dL (ref 6.5–8.1)

## 2015-10-15 LAB — URINALYSIS COMPLETE WITH MICROSCOPIC (ARMC ONLY)
BILIRUBIN URINE: NEGATIVE
Glucose, UA: NEGATIVE mg/dL
KETONES UR: NEGATIVE mg/dL
Nitrite: NEGATIVE
PH: 5 (ref 5.0–8.0)
Protein, ur: 100 mg/dL — AB
Specific Gravity, Urine: 1.027 (ref 1.005–1.030)

## 2015-10-15 LAB — LIPASE, BLOOD: Lipase: 27 U/L (ref 11–51)

## 2015-10-15 LAB — CBC
HCT: 38.2 % (ref 35.0–47.0)
Hemoglobin: 13.2 g/dL (ref 12.0–16.0)
MCH: 28.8 pg (ref 26.0–34.0)
MCHC: 34.5 g/dL (ref 32.0–36.0)
MCV: 83.4 fL (ref 80.0–100.0)
PLATELETS: 251 10*3/uL (ref 150–440)
RBC: 4.58 MIL/uL (ref 3.80–5.20)
RDW: 14.8 % — ABNORMAL HIGH (ref 11.5–14.5)
WBC: 9.2 10*3/uL (ref 3.6–11.0)

## 2015-10-15 LAB — TROPONIN I: Troponin I: <0.03 ng/mL (ref <0.031)

## 2015-10-15 LAB — POCT PREGNANCY, URINE: Preg Test, Ur: NEGATIVE

## 2015-10-15 MED ORDER — PHENAZOPYRIDINE HCL 200 MG PO TABS: 200.0000 mg | ORAL_TABLET | Freq: Three times a day (TID) | ORAL | Status: DC | PRN

## 2015-10-15 MED ORDER — CIPROFLOXACIN HCL 500 MG PO TABS: 500.0000 mg | ORAL_TABLET | Freq: Two times a day (BID) | ORAL | Status: AC

## 2015-10-15 MED ORDER — PHENAZOPYRIDINE HCL 200 MG PO TABS
200.0000 mg | ORAL_TABLET | Freq: Once | ORAL | Status: AC
  Administered 2015-10-15: 200 mg via ORAL
  Filled 2015-10-15: qty 1

## 2015-10-15 MED ORDER — IBUPROFEN 800 MG PO TABS: 800.0000 mg | ORAL_TABLET | Freq: Three times a day (TID) | ORAL | Status: DC | PRN

## 2015-10-15 MED ORDER — CIPROFLOXACIN HCL 500 MG PO TABS
500.0000 mg | ORAL_TABLET | Freq: Once | ORAL | Status: AC
  Administered 2015-10-15: 500 mg via ORAL
  Filled 2015-10-15: qty 1

## 2015-10-15 NOTE — Discharge Instructions (Signed)
Urinary Tract Infection Urinary tract infections (UTIs) can develop anywhere along your urinary tract. Your urinary tract is your body's drainage system for removing wastes and extra water. Your urinary tract includes two kidneys, two ureters, a bladder, and a urethra. Your kidneys are a pair of bean-shaped organs. Each kidney is about the size of your fist. They are located below your ribs, one on each side of your spine. CAUSES Infections are caused by microbes, which are microscopic organisms, including fungi, viruses, and bacteria. These organisms are so small that they can only be seen through a microscope. Bacteria are the microbes that most commonly cause UTIs. SYMPTOMS  Symptoms of UTIs may vary by age and gender of the patient and by the location of the infection. Symptoms in young women typically include a frequent and intense urge to urinate and a painful, burning feeling in the bladder or urethra during urination. Older women and men are more likely to be tired, shaky, and weak and have muscle aches and abdominal pain. A fever may mean the infection is in your kidneys. Other symptoms of a kidney infection include pain in your back or sides below the ribs, nausea, and vomiting. DIAGNOSIS To diagnose a UTI, your caregiver will ask you about your symptoms. Your caregiver will also ask you to provide a urine sample. The urine sample will be tested for bacteria and white blood cells. White blood cells are made by your body to help fight infection. TREATMENT  Typically, UTIs can be treated with medication. Because most UTIs are caused by a bacterial infection, they usually can be treated with the use of antibiotics. The choice of antibiotic and length of treatment depend on your symptoms and the type of bacteria causing your infection. HOME CARE INSTRUCTIONS  If you were prescribed antibiotics, take them exactly as your caregiver instructs you. Finish the medication even if you feel better after  you have only taken some of the medication.  Drink enough water and fluids to keep your urine clear or pale yellow.  Avoid caffeine, tea, and carbonated beverages. They tend to irritate your bladder.  Empty your bladder often. Avoid holding urine for long periods of time.  Empty your bladder before and after sexual intercourse.  After a bowel movement, women should cleanse from front to back. Use each tissue only once. SEEK MEDICAL CARE IF:   You have back pain.  You develop a fever.  Your symptoms do not begin to resolve within 3 days. SEEK IMMEDIATE MEDICAL CARE IF:   You have severe back pain or lower abdominal pain.  You develop chills.  You have nausea or vomiting.  You have continued burning or discomfort with urination. MAKE SURE YOU:   Understand these instructions.  Will watch your condition.  Will get help right away if you are not doing well or get worse.   This information is not intended to replace advice given to you by your health care provider. Make sure you discuss any questions you have with your health care provider.   Document Released: 02/06/2005 Document Revised: 01/18/2015 Document Reviewed: 06/07/2011 Elsevier Interactive Patient Education 2016 Elsevier Inc[employer]  Flank Pain Flank pain refers to pain that is located on the side of the body between the upper abdomen and the back. The pain may occur over a short period of time (acute) or may be long-term or reoccurring (chronic). It may be mild or severe. Flank pain can be caused by many things. CAUSES  Some of the  more common causes of flank pain include:  Muscle strains.   Muscle spasms.   A disease of your spine (vertebral disk disease).   A lung infection (pneumonia).   Fluid around your lungs (pulmonary edema).   A kidney infection.   Kidney stones.   A very painful skin rash caused by the chickenpox virus (shingles).   Gallbladder disease.  HOME CARE INSTRUCTIONS  Home  care will depend on the cause of your pain. In general,  Rest as directed by your caregiver.  Drink enough fluids to keep your urine clear or pale yellow.  Only take over-the-counter or prescription medicines as directed by your caregiver. Some medicines may help relieve the pain.  Tell your caregiver about any changes in your pain.  Follow up with your caregiver as directed. SEEK IMMEDIATE MEDICAL CARE IF:   Your pain is not controlled with medicine.   You have new or worsening symptoms.  Your pain increases.   You have abdominal pain.   You have shortness of breath.   You have persistent nausea or vomiting.   You have swelling in your abdomen.   You feel faint or pass out.   You have blood in your urine.  You have a fever or persistent symptoms for more than 2-3 days.  You have a fever and your symptoms suddenly get worse. MAKE SURE YOU:   Understand these instructions.  Will watch your condition.  Will get help right away if you are not doing well or get worse.   This information is not intended to replace advice given to you by your health care provider. Make sure you discuss any questions you have with your health care provider.   Document Released: 06/20/2005 Document Revised: 01/22/2012 Document Reviewed: 12/12/2011 Elsevier Interactive Patient Education [employer]

## 2015-10-15 NOTE — ED Notes (Signed)
POCT RESULTS WERE NEGATIVE 

## 2015-10-15 NOTE — ED Notes (Signed)
Pt from lobby to room 18 - blood and urine already resulted. Pt has c/o dysuria. Sitting comfortably in bed awaiting dr.

## 2015-10-15 NOTE — ED Notes (Signed)
Pt reports 2 days with dysuria, hematuria and urinary frequency; pt also adds intermittent epigastric pain for 1 year, that radiates through to her back; was seen [facility] ([place] the [place]) and was told to follow up with specialist; they have not called her yet with appointment information; since she's here with hematuria she'd like to evaluated for this as well; cholecystectomy 09/23/2013

## 2015-10-15 NOTE — ED Provider Notes (Signed)
[facility] Emergency Department Provider Note        Time seen: ----------------------------------------- 9:38 PM on 10/15/2015 -----------------------------------------    I have reviewed the triage vital signs and the nursing notes.   HISTORY  Chief Complaint Abdominal Pain; Urinary Frequency; and Hematuria    HPI Paula Page is a 35 y.o. female who presents to ER for dysuria, hematuria and urinary frequency. Patient reports intermittent right upper quadrant and epigastric pain that radiates into her back for the last year. Patient had these symptoms prior to cholecystectomy but the symptoms are no better. She denies fevers chills or other complaints. She does have some right-sided back pain currently.   Past Medical History  Diagnosis Date  . GERD (gastroesophageal reflux disease)     Patient Active Problem List   Diagnosis Date Noted  . Gallstones 09/02/2013    Past Surgical History  Procedure Laterality Date  . Irrigation and debridement sebaceous cyst    . Cholecystectomy  09/23/13    Allergies Diclegis; Penicillins; and Sulfa antibiotics  Social History Social History  Substance Use Topics  . Smoking status: Never Smoker   . Smokeless tobacco: Never Used  . Alcohol Use: No    Review of Systems Constitutional: Negative for fever. Cardiovascular: Negative for chest pain. Respiratory: Negative for shortness of breath. Gastrointestinal: Positive for abdominal pain Genitourinary: Positive for dysuria, polyuria, hematuria Musculoskeletal: Positive for right-sided low back pain Skin: Negative for rash. Neurological: Negative for headaches, focal weakness or numbness.  10-point ROS otherwise negative.  ____________________________________________   PHYSICAL EXAM:  VITAL SIGNS: ED Triage Vitals  Enc Vitals Group     BP 10/15/15 2046 122/76 mmHg     Pulse Rate 10/15/15 2046 70     Resp 10/15/15 2046 18     Temp 10/15/15  2046 98.5 F (36.9 C)     Temp Source 10/15/15 2046 Oral     SpO2 10/15/15 2046 100 %     Weight 10/15/15 2046 164 lb (74.39 kg)     Height 10/15/15 2046 5\' 9"  (1.753 m)     Head Cir --      Peak Flow --      Pain Score 10/15/15 2047 7     Pain Loc --      Pain Edu? --      Excl. in GC? --    Constitutional: Alert and oriented. Well appearing and in no distress. Eyes: Conjunctivae are normal. PERRL. Normal extraocular movements. ENT   Head: Normocephalic and atraumatic.   Nose: No congestion/rhinnorhea.   Mouth/Throat: Mucous membranes are moist.   Neck: No stridor. Cardiovascular: Normal rate, regular rhythm. No murmurs, rubs, or gallops. Respiratory: Normal respiratory effort without tachypnea nor retractions. Breath sounds are clear and equal bilaterally. No wheezes/rales/rhonchi. Gastrointestinal: Soft and nontender. Normal bowel sounds, possible right CVA tenderness Musculoskeletal: Nontender with normal range of motion in all extremities. No lower extremity tenderness nor edema. Neurologic:  Normal speech and language. No gross focal neurologic deficits are appreciated.  Skin:  Skin is warm, dry and intact. No rash noted. Psychiatric: Mood and affect are normal. Speech and behavior are normal.  ____________________________________________  ED COURSE:  Pertinent labs & imaging results that were available during my care of the patient were reviewed by me and considered in my medical decision making (see chart for details). Patient is in no acute distress, we will get basic labs, urinalysis and consider further imaging depending on the urine results. ____________________________________________  LABS (pertinent positives/negatives)  Labs Reviewed  COMPREHENSIVE METABOLIC PANEL - Abnormal; Notable for the following:    Calcium 8.8 (*)    Total Bilirubin 0.2 (*)    All other components within normal limits  CBC - Abnormal; Notable for the following:    RDW  14.8 (*)    All other components within normal limits  URINALYSIS COMPLETEWITH MICROSCOPIC ([facility] ONLY) - Abnormal; Notable for the following:    Color, Urine YELLOW (*)    APPearance CLOUDY (*)    Hgb urine dipstick 3+ (*)    Protein, ur 100 (*)    Leukocytes, UA 3+ (*)    Bacteria, UA RARE (*)    Squamous Epithelial / LPF 0-5 (*)    All other components within normal limits  LIPASE, BLOOD  TROPONIN I  POC URINE PREG, ED  POCT PREGNANCY, URINE    RADIOLOGY Images were viewed by me  CT renal protocol IMPRESSION: No acute finding. No hydronephrosis or nephrolithiasis.  ____________________________________________  FINAL ASSESSMENT AND PLAN  Flank pain, cystitis  Plan: Patient with labs and imaging as dictated above. Flank pain is likely secondary to scar tissue from her cholecystectomy. No other etiology is identified. She does have a bad UTI, started on Cipro and Pyridium. She was discharged on similar, she stable for outpatient follow-up.   Emily Filbert, MD   Note: This dictation was prepared with Dragon dictation. Any transcriptional errors that result from this process are unintentional   Emily Filbert, MD 10/15/15 2245

## 2016-03-26 ENCOUNTER — Ambulatory Visit (INDEPENDENT_AMBULATORY_CARE_PROVIDER_SITE_OTHER): Payer: Managed Care, Other (non HMO) | Admitting: Internal Medicine

## 2016-03-26 ENCOUNTER — Encounter: Payer: Self-pay | Admitting: Internal Medicine

## 2016-03-26 VITALS — BP 125/82 | HR 94 | Temp 98.9°F | Ht 69.0 in | Wt 175.0 lb

## 2016-03-26 DIAGNOSIS — Z23 Encounter for immunization: Secondary | ICD-10-CM

## 2016-03-26 DIAGNOSIS — M791 Myalgia, unspecified site: Secondary | ICD-10-CM | POA: Insufficient documentation

## 2016-03-26 LAB — SEDIMENTATION RATE: SED RATE: 17 mm/h (ref 0–20)

## 2016-03-26 LAB — C-REACTIVE PROTEIN: CRP: 0.1 mg/dL — AB (ref 0.5–20.0)

## 2016-03-26 LAB — CK: CK TOTAL: 107 U/L (ref 7–177)

## 2016-03-26 LAB — T4, FREE: FREE T4: 0.74 ng/dL (ref 0.60–1.60)

## 2016-03-26 LAB — VITAMIN B12: Vitamin B-12: 629 pg/mL (ref 211–911)

## 2016-03-26 NOTE — Addendum Note (Signed)
Addended by: Eual FinesBRIDGES, Keayra Graham P on: 03/26/2016 01:11 PM   Modules accepted: Orders

## 2016-03-26 NOTE — Assessment & Plan Note (Signed)
Recurrent spells that are debilitating but short lived Picture not really consistent with fibromyalgia--but no other obvious etiology No systemic symptoms or decline over the almost 10 years since they started She is concerned about autoimmune condition--but no arthritis, synovitis, etc Will check some labs Discussed trial of symptomatic Rx (like muscle relaxer)--she prefers to hold off

## 2016-03-26 NOTE — Progress Notes (Signed)
Subjective:    Patient ID: Paula Page, female    DOB: 03-01-1981, 35 y.o.   MRN: [id number]  HPI Here to establish care Also due to having some pain issues  For years, she will get "flare ups" that can be debilitating Widespread pain--- dull and aching in back, prickly in feet Hands can be stiff and swell some All these seem to happen at the same time Has missed work with some flares--usually up to 3 days (but hasn't missed work lately) Last flare in left hip just recently Gets [facility] in shifts (15 minute breaks at work) Ibuprofen doesn't seem to help this She does try to stretch, heat and ice and chiropractic  No fevers with this--- but has felt like "this could be the flu" No preceding illness before these symptoms started-- ~2008 No chest pain or cough No GI symptoms with this--but has had pain since gallbladder removed Is seeing chiropractor about this--trying to get adjustments to help scar tissue (deep abdominal massage does seem to help)  Some concern due to mom's history (I used to see her --[employer]) Neurologic symptoms like stroke/TIA--but defied diagnosis  Current Outpatient Prescriptions on File Prior to Visit  Medication Sig Dispense Refill  . ibuprofen (ADVIL,MOTRIN) 800 MG tablet Take 1 tablet (800 mg total) by mouth every 8 (eight) hours as needed. 30 tablet 0   No current facility-administered medications on file prior to visit.     Allergies  Allergen Reactions  . Diclegis [Doxylamine-Pyridoxine] Hives  . Penicillins Hives  . Sulfa Antibiotics Hives    Past Medical History:  Diagnosis Date  . GERD (gastroesophageal reflux disease)    during pregnancy and before chole    Past Surgical History:  Procedure Laterality Date  . CHOLECYSTECTOMY  09/23/13  . IRRIGATION AND DEBRIDEMENT SEBACEOUS CYST      Family History  Problem Relation Age of Onset  . Stroke Mother   . Hypertension Mother   .  Hyperlipidemia Father   . Diabetes Paternal Uncle   . Diabetes Paternal Grandmother   . Cancer Paternal Grandfather [id number]    stomach cancer  . Alcohol abuse Paternal Grandfather     Social History   Social History  . Marital status: Single    Spouse name: N/A  . Number of children: 3  . Years of education: N/A   Occupational History  . 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 Smoking status: Never Smoker  . Smokeless tobacco: Never Used  . Alcohol use No  . Drug use: No  . Sexual activity: Not on file   Other Topics Concern  . Not on file   Social History Narrative   Single   [id number], 35 year old children   2 different fathers --both involved in care   Review of Systems  Constitutional: Positive for unexpected weight change.       Weight is up--relates to her age. Feels she needs to lose 10-15# Hard to exercise due to flares Wears seat belt  HENT: Negative for dental problem.        Keeps up with dentist  Eyes: Negative for visual disturbance.       No diplopia or unilateral vision loss--but does have problems with night vision  Respiratory: Negative for cough, chest tightness and shortness of breath.   Cardiovascular: Negative for chest pain and palpitations.  Leg swelling in the past--not in the past 2 years  Gastrointestinal: Negative for abdominal pain, blood in stool, nausea and vomiting.  Endocrine: Negative for polydipsia and polyuria.  Genitourinary: Positive for hematuria. Negative for dysuria.       Traces of blood in urine since age 35  Musculoskeletal: Positive for arthralgias and myalgias.       Mostly muscle pain but some joint pain  Skin: Negative for rash.  Allergic/Immunologic: Positive for environmental allergies. Negative for immunocompromised state.       Rarely uses meds  Neurological: Negative for dizziness, syncope, light-headedness and headaches.  Hematological: Negative for adenopathy. Bruises/bleeds  easily.  Psychiatric/Behavioral: The patient is not nervous/anxious.        Some sleep problems if she is in pain Irritable from pain--otherwise mood okay       Objective:   Physical Exam  Constitutional: She appears well-developed and well-nourished. No distress.  HENT:  Mouth/Throat: Oropharynx is clear and moist. No oropharyngeal exudate.  Neck: Normal range of motion. Neck supple. No thyromegaly present.  Cardiovascular: Normal rate, regular rhythm, normal heart sounds and intact distal pulses.  Exam reveals no gallop.   No murmur heard. Pulmonary/Chest: Effort normal and breath sounds normal. No respiratory distress. She has no wheezes. She has no rales.  Abdominal: Soft. There is no tenderness.  Musculoskeletal: She exhibits no edema.  No synovitis No obvious tender trigger points   Lymphadenopathy:    She has no cervical adenopathy.  Skin: No rash noted. No erythema.  Psychiatric: She has a normal mood and affect. Her behavior is normal.          Assessment & Plan:

## 2016-03-26 NOTE — Progress Notes (Signed)
Pre visit review using our clinic review tool, if applicable. No additional management support is needed unless otherwise documented below in the visit note. 

## 2016-07-15 LAB — LIPID PANEL
CHOLESTEROL: 192 mg/dL (ref 0–200)
HDL: 51 mg/dL (ref 35–70)

## 2016-07-15 LAB — BASIC METABOLIC PANEL: GLUCOSE: 85 mg/dL

## 2016-09-18 ENCOUNTER — Encounter: Payer: Self-pay | Admitting: Internal Medicine

## 2016-09-18 ENCOUNTER — Ambulatory Visit (INDEPENDENT_AMBULATORY_CARE_PROVIDER_SITE_OTHER): Payer: Managed Care, Other (non HMO) | Admitting: Internal Medicine

## 2016-09-18 ENCOUNTER — Other Ambulatory Visit (HOSPITAL_COMMUNITY)
Admission: RE | Admit: 2016-09-18 | Discharge: 2016-09-18 | Disposition: A | Discharge status: Home / Self Care | Payer: Managed Care, Other (non HMO) | Source: Ambulatory Visit | Attending: Internal Medicine | Admitting: Internal Medicine

## 2016-09-18 VITALS — BP 116/70 | HR 79 | Temp 98.3°F | Ht 68.0 in | Wt 179.0 lb

## 2016-09-18 DIAGNOSIS — Z0000 Encounter for general adult medical examination without abnormal findings: Secondary | ICD-10-CM | POA: Insufficient documentation

## 2016-09-18 MED ORDER — MOMETASONE FUROATE 50 MCG/ACT NA SUSP: 2.0000 | Freq: Every day | NASAL | 12 refills | Status: DC

## 2016-09-18 NOTE — Addendum Note (Signed)
Addended by: Desmond DikeKNIGHT, Addylynn Balin H on: 09/18/2016 12:36 PM   Modules accepted: Orders

## 2016-09-18 NOTE — Progress Notes (Signed)
Subjective:    Patient ID: Paula CobbsLeslie M Humes, female    DOB: 1981-01-22, 36 y.o.   MRN: [id number]  HPI Here for physical  No new concerns Will have some achiness at times Getting pulse electromagnetic therapy at chiropractor--tries to go weekly Tries blueberry wine for inflammation feeling in muscles Hasn't even been using the ibuprofen No missed work  Current Outpatient Prescriptions on File Prior to Visit  Medication Sig Dispense Refill  . ibuprofen (ADVIL,MOTRIN) 800 MG tablet Take 1 tablet (800 mg total) by mouth every 8 (eight) hours as needed. 30 tablet 0   No current facility-administered medications on file prior to visit.     Allergies  Allergen Reactions  . Diclegis [Doxylamine-Pyridoxine] Hives  . Penicillins Hives  . Sulfa Antibiotics Hives    Past Medical History:  Diagnosis Date  . GERD (gastroesophageal reflux disease)    during pregnancy and before chole    Past Surgical History:  Procedure Laterality Date  . CHOLECYSTECTOMY  09/23/13  . IRRIGATION AND DEBRIDEMENT SEBACEOUS CYST      Family History  Problem Relation Age of Onset  . Stroke Mother   . Hypertension Mother   . Hyperlipidemia Father   . Diabetes Paternal Uncle   . Diabetes Paternal Grandmother   . Cancer Paternal Grandfather [id number]    stomach cancer  . Alcohol abuse Paternal Grandfather     Social History   Social History  . Marital status: Single    Spouse name: N/A  . Number of children: 3  . Years of education: N/A   Occupational History  . 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 Smoking status: Never Smoker  . Smokeless tobacco: Never Used  . Alcohol use No  . Drug use: No  . Sexual activity: Not on file   Other Topics Concern  . Not on file   Social History Narrative   Single   [id number], 739 year old children   2 different fathers --both involved in care   Review of Systems  Constitutional: Negative for fatigue and unexpected weight  change.       No exercise lately--but does walk and do some Pilates. Wears seat belt  HENT: Negative for dental problem, hearing loss and tinnitus.        Keeps up with dentist  Eyes: Negative for visual disturbance.       No diplopia or unilateral vision loss  Respiratory: Negative for cough, chest tightness and shortness of breath.   Cardiovascular: Negative for chest pain, palpitations and leg swelling.  Gastrointestinal: Negative for abdominal pain, blood in stool, constipation and nausea.       No heartburn  Endocrine: Negative for polydipsia and polyuria.  Genitourinary: Negative for dyspareunia, dysuria and frequency.       Monogamous with partner--he uses condoms every time Discussed using foam also Periods are regular--somewhat shorter  Musculoskeletal: Positive for back pain and myalgias. Negative for arthralgias.  Skin: Negative for rash.  Allergic/Immunologic: Positive for environmental allergies. Negative for immunocompromised state.       Hasn't needed meds lately--likes nasonex  Neurological: Negative for dizziness, syncope, light-headedness and headaches.  Hematological: Negative for adenopathy. Bruises/bleeds easily.  Psychiatric/Behavioral: Negative for dysphoric mood and sleep disturbance. The patient is nervous/anxious.        Objective:   Physical Exam  Constitutional: She is oriented to person, place, and time. She appears well-developed and well-nourished. No distress.  HENT:  Head: Normocephalic and atraumatic.  Right Ear: External ear normal.  Left Ear: External ear normal.  Mouth/Throat: Oropharynx is clear and moist. No oropharyngeal exudate.  Eyes: Conjunctivae are normal. Pupils are equal, round, and reactive to light.  Neck: Normal range of motion. Neck supple. No thyromegaly present.  Cardiovascular: Normal rate, regular rhythm, normal heart sounds and intact distal pulses.  Exam reveals no gallop.   No murmur heard. Pulmonary/Chest: Effort normal  and breath sounds normal. No respiratory distress. She has no wheezes. She has no rales.  Abdominal: Soft. There is no tenderness.  Genitourinary:  Genitourinary Comments: Normal introitus Cervix appears normal Pap done  Musculoskeletal: She exhibits no edema or tenderness.  Lymphadenopathy:    She has no cervical adenopathy.  Neurological: She is alert and oriented to person, place, and time.  Skin: No rash noted. No erythema.  Psychiatric: She has a normal mood and affect. Her behavior is normal.          Assessment & Plan:

## 2016-09-18 NOTE — Patient Instructions (Signed)
DASH Eating Plan DASH stands for "Dietary Approaches to Stop Hypertension." The DASH eating plan is a healthy eating plan that has been shown to reduce high blood pressure (hypertension). It may also reduce your risk for type 2 diabetes, heart disease, and stroke. The DASH eating plan may also help with weight loss. What are tips for following this plan? General guidelines  Avoid eating more than 2,300 mg (milligrams) of salt (sodium) a day. If you have hypertension, you may need to reduce your sodium intake to 1,500 mg a day.  Limit alcohol intake to no more than 1 drink a day for nonpregnant women and 2 drinks a day for men. One drink equals 12 oz of beer, 5 oz of wine, or 1 oz of hard liquor.  Work with your health care provider to maintain a healthy body weight or to lose weight. Ask what an ideal weight is for you.  Get at least 30 minutes of exercise that causes your heart to beat faster (aerobic exercise) most days of the week. Activities may include walking, swimming, or biking.  Work with your health care provider or diet and nutrition specialist (dietitian) to adjust your eating plan to your individual calorie needs. Reading food labels  Check food labels for the amount of sodium per serving. Choose foods with less than 5 percent of the Daily Value of sodium. Generally, foods with less than 300 mg of sodium per serving fit into this eating plan.  To find whole grains, look for the word "whole" as the first word in the ingredient list. Shopping  Buy products labeled as "low-sodium" or "no salt added."  Buy fresh foods. Avoid canned foods and premade or frozen meals. Cooking  Avoid adding salt when cooking. Use salt-free seasonings or herbs instead of table salt or sea salt. Check with your health care provider or pharmacist before using salt substitutes.  Do not fry foods. Cook foods using healthy methods such as baking, boiling, grilling, and broiling instead.  Cook with  heart-healthy oils, such as olive, canola, soybean, or sunflower oil. Meal planning   Eat a balanced diet that includes: ? 5 or more servings of fruits and vegetables each day. At each meal, try to fill half of your plate with fruits and vegetables. ? Up to 6-8 servings of whole grains each day. ? Less than 6 oz of lean meat, poultry, or fish each day. A 3-oz serving of meat is about the same size as a deck of cards. One egg equals 1 oz. ? 2 servings of low-fat dairy each day. ? A serving of nuts, seeds, or beans 5 times each week. ? Heart-healthy fats. Healthy fats called Omega-3 fatty acids are found in foods such as flaxseeds and coldwater fish, like sardines, salmon, and mackerel.  Limit how much you eat of the following: ? Canned or prepackaged foods. ? Food that is high in trans fat, such as fried foods. ? Food that is high in saturated fat, such as fatty meat. ? Sweets, desserts, sugary drinks, and other foods with added sugar. ? Full-fat dairy products.  Do not salt foods before eating.  Try to eat at least 2 vegetarian meals each week.  Eat more home-cooked food and less restaurant, buffet, and fast food.  When eating at a restaurant, ask that your food be prepared with less salt or no salt, if possible. What foods are recommended? The items listed may not be a complete list. Talk with your dietitian about what   dietary choices are best for you. Grains Whole-grain or whole-wheat bread. Whole-grain or whole-wheat pasta. Cancro rice. Oatmeal. Quinoa. Bulgur. Whole-grain and low-sodium cereals. Pita bread. Low-fat, low-sodium crackers. Whole-wheat flour tortillas. Vegetables Fresh or frozen vegetables (raw, steamed, roasted, or grilled). Low-sodium or reduced-sodium tomato and vegetable juice. Low-sodium or reduced-sodium tomato sauce and tomato paste. Low-sodium or reduced-sodium canned vegetables. Fruits All fresh, dried, or frozen fruit. Canned fruit in natural juice (without  added sugar). Meat and other protein foods Skinless chicken or turkey. Ground chicken or turkey. Pork with fat trimmed off. Fish and seafood. Egg whites. Dried beans, peas, or lentils. Unsalted nuts, nut butters, and seeds. Unsalted canned beans. Lean cuts of beef with fat trimmed off. Low-sodium, lean deli meat. Dairy Low-fat (1%) or fat-free (skim) milk. Fat-free, low-fat, or reduced-fat cheeses. Nonfat, low-sodium ricotta or cottage cheese. Low-fat or nonfat yogurt. Low-fat, low-sodium cheese. Fats and oils Soft margarine without trans fats. Vegetable oil. Low-fat, reduced-fat, or light mayonnaise and salad dressings (reduced-sodium). Canola, safflower, olive, soybean, and sunflower oils. Avocado. Seasoning and other foods Herbs. Spices. Seasoning mixes without salt. Unsalted popcorn and pretzels. Fat-free sweets. What foods are not recommended? The items listed may not be a complete list. Talk with your dietitian about what dietary choices are best for you. Grains Baked goods made with fat, such as croissants, muffins, or some breads. Dry pasta or rice meal packs. Vegetables Creamed or fried vegetables. Vegetables in a cheese sauce. Regular canned vegetables (not low-sodium or reduced-sodium). Regular canned tomato sauce and paste (not low-sodium or reduced-sodium). Regular tomato and vegetable juice (not low-sodium or reduced-sodium). Pickles. Olives. Fruits Canned fruit in a light or heavy syrup. Fried fruit. Fruit in cream or butter sauce. Meat and other protein foods Fatty cuts of meat. Ribs. Fried meat. Bacon. Sausage. Bologna and other processed lunch meats. Salami. Fatback. Hotdogs. Bratwurst. Salted nuts and seeds. Canned beans with added salt. Canned or smoked fish. Whole eggs or egg yolks. Chicken or turkey with skin. Dairy Whole or 2% milk, cream, and half-and-half. Whole or full-fat cream cheese. Whole-fat or sweetened yogurt. Full-fat cheese. Nondairy creamers. Whipped toppings.  Processed cheese and cheese spreads. Fats and oils Butter. Stick margarine. Lard. Shortening. Ghee. Bacon fat. Tropical oils, such as coconut, palm kernel, or palm oil. Seasoning and other foods Salted popcorn and pretzels. Onion salt, garlic salt, seasoned salt, table salt, and sea salt. Worcestershire sauce. Tartar sauce. Barbecue sauce. Teriyaki sauce. Soy sauce, including reduced-sodium. Steak sauce. Canned and packaged gravies. Fish sauce. Oyster sauce. Cocktail sauce. Horseradish that you find on the shelf. Ketchup. Mustard. Meat flavorings and tenderizers. Bouillon cubes. Hot sauce and Tabasco sauce. Premade or packaged marinades. Premade or packaged taco seasonings. Relishes. Regular salad dressings. Where to find more information:  National Heart, Lung, and Blood Institute: www.nhlbi.nih.gov  American Heart Association: www.heart.org Summary  The DASH eating plan is a healthy eating plan that has been shown to reduce high blood pressure (hypertension). It may also reduce your risk for type 2 diabetes, heart disease, and stroke.  With the DASH eating plan, you should limit salt (sodium) intake to 2,300 mg a day. If you have hypertension, you may need to reduce your sodium intake to 1,500 mg a day.  When on the DASH eating plan, aim to eat more fresh fruits and vegetables, whole grains, lean proteins, low-fat dairy, and heart-healthy fats.  Work with your health care provider or diet and nutrition specialist (dietitian) to adjust your eating plan to your individual   calorie needs. This information is not intended to replace advice given to you by your health care provider. Make sure you discuss any questions you have with your health care provider. Document Released: 04/18/2011 Document Revised: 04/22/2016 Document Reviewed: 04/22/2016 Elsevier Interactive Patient Education  2017 Elsevier Inc.  

## 2016-09-18 NOTE — Assessment & Plan Note (Signed)
Healthy Discussed regular low grade exercise Consider yearly flu vaccine Pap done

## 2016-09-18 NOTE — Progress Notes (Signed)
Pre visit review using our clinic review tool, if applicable. No additional management support is needed unless otherwise documented below in the visit note. 

## 2016-09-20 LAB — CYTOLOGY - PAP
BACTERIAL VAGINITIS: NEGATIVE
CHLAMYDIA, DNA PROBE: NEGATIVE
DIAGNOSIS: NEGATIVE
HPV: NOT DETECTED
NEISSERIA GONORRHEA: NEGATIVE
Trichomonas: NEGATIVE

## 2016-09-24 LAB — CERVICOVAGINAL ANCILLARY ONLY: HERPES (WINDOWPATH): NEGATIVE

## 2016-09-25 ENCOUNTER — Encounter: Payer: Self-pay | Admitting: Internal Medicine

## 2016-10-06 ENCOUNTER — Emergency Department
Admission: EM | Admit: 2016-10-06 | Discharge: 2016-10-06 | Disposition: A | Discharge status: Home / Self Care | Payer: Managed Care, Other (non HMO) | Attending: Emergency Medicine | Admitting: Emergency Medicine

## 2016-10-06 ENCOUNTER — Encounter: Payer: Self-pay | Admitting: Emergency Medicine

## 2016-10-06 DIAGNOSIS — S161XXA Strain of muscle, fascia and tendon at neck level, initial encounter: Secondary | ICD-10-CM | POA: Diagnosis not present

## 2016-10-06 DIAGNOSIS — Y929 Unspecified place or not applicable: Secondary | ICD-10-CM | POA: Diagnosis not present

## 2016-10-06 DIAGNOSIS — S39012A Strain of muscle, fascia and tendon of lower back, initial encounter: Secondary | ICD-10-CM | POA: Insufficient documentation

## 2016-10-06 DIAGNOSIS — Y939 Activity, unspecified: Secondary | ICD-10-CM | POA: Insufficient documentation

## 2016-10-06 DIAGNOSIS — Y999 Unspecified external cause status: Secondary | ICD-10-CM | POA: Insufficient documentation

## 2016-10-06 DIAGNOSIS — R52 Pain, unspecified: Secondary | ICD-10-CM | POA: Diagnosis present

## 2016-10-06 MED ORDER — CYCLOBENZAPRINE HCL 5 MG PO TABS: 5.0000 mg | ORAL_TABLET | Freq: Three times a day (TID) | ORAL | 0 refills | Status: DC | PRN

## 2016-10-06 MED ORDER — KETOROLAC TROMETHAMINE 60 MG/2ML IM SOLN
30.0000 mg | Freq: Once | INTRAMUSCULAR | Status: AC
  Administered 2016-10-06: 30 mg via INTRAMUSCULAR
  Filled 2016-10-06: qty 2

## 2016-10-06 MED ORDER — TRAMADOL HCL 50 MG PO TABS: 50.0000 mg | ORAL_TABLET | Freq: Four times a day (QID) | ORAL | 0 refills | Status: DC | PRN

## 2016-10-06 MED ORDER — IBUPROFEN 800 MG PO TABS: 800.0000 mg | ORAL_TABLET | Freq: Four times a day (QID) | ORAL | 0 refills | Status: DC | PRN
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
## 2016-10-06 NOTE — Discharge Instructions (Signed)
Please take medications as prescribed. Work on gentle stretching exercises. Follow up with primary care provider in one week if no improvement. Return to the ER for any worsening symptoms urgent changes in her health.

## 2016-10-08 ENCOUNTER — Telehealth: Payer: Self-pay
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
## 2016-10-11 ENCOUNTER — Ambulatory Visit (INDEPENDENT_AMBULATORY_CARE_PROVIDER_SITE_OTHER): Payer: PRIVATE HEALTH INSURANCE | Admitting: Internal Medicine

## 2016-10-11 ENCOUNTER — Encounter: Payer: Self-pay | Admitting: Internal Medicine

## 2016-10-11 DIAGNOSIS — M542 Cervicalgia: Secondary | ICD-10-CM
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
## 2016-12-05 ENCOUNTER — Ambulatory Visit: Payer: Self-pay | Admitting: Physician Assistant

## 2016-12-05 VITALS — BP 120/80 | HR 100 | Temp 98.5°F | Resp 16

## 2016-12-05 DIAGNOSIS — R350 Frequency of micturition: Secondary | ICD-10-CM

## 2016-12-05 LAB — POCT URINALYSIS DIPSTICK
Bilirubin, UA: NEGATIVE
Glucose, UA: NEGATIVE
KETONES UA: NEGATIVE
LEUKOCYTES UA: NEGATIVE
NITRITE UA: NEGATIVE
PH UA: 6.5 (ref 5.0–8.0)
PROTEIN UA: NEGATIVE
Spec Grav, UA: 1.025 (ref 1.010–1.025)
UROBILINOGEN UA: 0.2 U/dL

## 2016-12-05 LAB — POCT URINE PREGNANCY: Preg Test, Ur: NEGATIVE

## 2016-12-05 MED ORDER — IBUPROFEN 800 MG PO TABS: 800.0000 mg | ORAL_TABLET | Freq: Four times a day (QID) | ORAL | 3 refills | Status: DC | PRN

## 2016-12-05 NOTE — Progress Notes (Signed)
S: C/o urinary freq, some pressure and pelvic pain, had a heavy period which only lasted 2 days, doesn't usually use tampons and used them this time, no fever/chills/vag discharge  O: vitals wnl, nad, no cva tenderness, ua 2+ blood, urine preg neg  A: urinary freq with pelvic pressure, f/u with gyn

## 2017-04-10 ENCOUNTER — Encounter: Payer: Self-pay | Admitting: Family

## 2017-04-10 ENCOUNTER — Ambulatory Visit: Payer: Self-pay | Admitting: Family

## 2017-04-10 VITALS — BP 110/70 | HR 86 | Temp 98.5°F | Resp 16

## 2017-04-10 DIAGNOSIS — B349 Viral infection, unspecified: Secondary | ICD-10-CM

## 2017-04-10 MED ORDER — ONDANSETRON 8 MG PO TBDP: 8.0000 mg | ORAL_TABLET | Freq: Three times a day (TID) | ORAL | 0 refills | Status: DC | PRN
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
## 2017-05-14 DIAGNOSIS — R102 Pelvic and perineal pain: Secondary | ICD-10-CM | POA: Insufficient documentation

## 2017-05-14 DIAGNOSIS — G8929 Other chronic pain: Secondary | ICD-10-CM | POA: Insufficient documentation

## 2017-06-20 ENCOUNTER — Ambulatory Visit: Payer: Self-pay | Admitting: Physician Assistant

## 2017-06-20 ENCOUNTER — Encounter: Payer: Self-pay | Admitting: Physician Assistant

## 2017-06-20 ENCOUNTER — Other Ambulatory Visit: Payer: Self-pay

## 2017-06-20 VITALS — BP 126/80 | HR 97 | Temp 99.2°F

## 2017-06-20 DIAGNOSIS — L500 Allergic urticaria: Secondary | ICD-10-CM

## 2017-06-20 MED ORDER — HYDROXYZINE HCL 50 MG PO TABS: 50.0000 mg | ORAL_TABLET | Freq: Three times a day (TID) | ORAL | 0 refills | Status: DC | PRN

## 2017-06-20 MED ORDER — METHYLPREDNISOLONE 4 MG PO TBPK: ORAL_TABLET | ORAL | 0 refills | Status: DC

## 2017-06-20 NOTE — Progress Notes (Signed)
Patient has been referred to see Dr. [place] CallasSharma at [facility] on 07/24/17 at 1:45pm.  Patient has been notified and has accepted the appointment.

## 2017-06-20 NOTE — Progress Notes (Signed)
   Subjective: Hives    Patient ID: Paula Page, female    DOB: April 27, 1981, 37 y.o.   MRN: [id number]  HPI Patient state diffuse hives for 1 month.  Patient state intimating outbreaks of unknown trigger.  Patient suspect new birth control which started last month.  Patient state hives appeared approximately 2 weeks after starting new birth control.  Patient states outbreak wax and wane and has she has not stopped taking the birth control.  Patient also voices concern that it might be stress from her job because any outbreaks.  Patient denies new food, personal hygiene or laundry products.  Patient state mild relief over-the-counter Benadryl.  Patient states intense itching with outbreaks.  Last outbreak was last night and mild relief with 50 mg of Benadryl.  Review of Systems Negative except for complaint    Objective:   Physical Exam No acute distress.  Physical exam reveals no lesion at this time.  Patient showed a cell phone camera revealing macular lesions on the abdomen and inner thighs.       Assessment & Plan: Urticaria  Patient will consulted to allergist/dermatology.  Patient given prescription for Medrol Dosepak and Atarax.

## 2017-11-28 DIAGNOSIS — J019 Acute sinusitis, unspecified: Secondary | ICD-10-CM | POA: Diagnosis not present

## 2017-11-28 DIAGNOSIS — H66001 Acute suppurative otitis media without spontaneous rupture of ear drum, right ear: Secondary | ICD-10-CM | POA: Diagnosis not present

## 2017-11-28 DIAGNOSIS — J302 Other seasonal allergic rhinitis: Secondary | ICD-10-CM | POA: Diagnosis not present

## 2017-12-27 DIAGNOSIS — R51 Headache: Secondary | ICD-10-CM | POA: Diagnosis not present

## 2017-12-27 DIAGNOSIS — S46811A Strain of other muscles, fascia and tendons at shoulder and upper arm level, right arm, initial encounter: Secondary | ICD-10-CM | POA: Diagnosis not present

## 2017-12-27 DIAGNOSIS — H6980 Other specified disorders of Eustachian tube, unspecified ear: Secondary | ICD-10-CM | POA: Diagnosis not present

## 2018-01-05 DIAGNOSIS — M9901 Segmental and somatic dysfunction of cervical region: Secondary | ICD-10-CM | POA: Diagnosis not present

## 2018-01-05 DIAGNOSIS — M9903 Segmental and somatic dysfunction of lumbar region: Secondary | ICD-10-CM | POA: Diagnosis not present

## 2018-01-05 DIAGNOSIS — M542 Cervicalgia: Secondary | ICD-10-CM | POA: Diagnosis not present

## 2018-01-08 DIAGNOSIS — M9901 Segmental and somatic dysfunction of cervical region: Secondary | ICD-10-CM | POA: Diagnosis not present

## 2018-01-08 DIAGNOSIS — M9903 Segmental and somatic dysfunction of lumbar region: Secondary | ICD-10-CM | POA: Diagnosis not present

## 2018-01-08 DIAGNOSIS — M542 Cervicalgia: Secondary | ICD-10-CM | POA: Diagnosis not present

## 2018-01-13 DIAGNOSIS — M9903 Segmental and somatic dysfunction of lumbar region: Secondary | ICD-10-CM | POA: Diagnosis not present

## 2018-01-13 DIAGNOSIS — M542 Cervicalgia: Secondary | ICD-10-CM | POA: Diagnosis not present

## 2018-01-13 DIAGNOSIS — M9901 Segmental and somatic dysfunction of cervical region: Secondary | ICD-10-CM | POA: Diagnosis not present

## 2018-01-20 DIAGNOSIS — M542 Cervicalgia: Secondary | ICD-10-CM | POA: Diagnosis not present

## 2018-01-20 DIAGNOSIS — M9901 Segmental and somatic dysfunction of cervical region: Secondary | ICD-10-CM | POA: Diagnosis not present

## 2018-01-20 DIAGNOSIS — M9903 Segmental and somatic dysfunction of lumbar region: Secondary | ICD-10-CM | POA: Diagnosis not present

## 2018-01-22 DIAGNOSIS — M9903 Segmental and somatic dysfunction of lumbar region: Secondary | ICD-10-CM | POA: Diagnosis not present

## 2018-01-22 DIAGNOSIS — M542 Cervicalgia: Secondary | ICD-10-CM | POA: Diagnosis not present

## 2018-01-22 DIAGNOSIS — M9901 Segmental and somatic dysfunction of cervical region: Secondary | ICD-10-CM | POA: Diagnosis not present

## 2018-01-27 DIAGNOSIS — M9901 Segmental and somatic dysfunction of cervical region: Secondary | ICD-10-CM | POA: Diagnosis not present

## 2018-01-27 DIAGNOSIS — M9903 Segmental and somatic dysfunction of lumbar region: Secondary | ICD-10-CM | POA: Diagnosis not present

## 2018-01-27 DIAGNOSIS — M542 Cervicalgia: Secondary | ICD-10-CM | POA: Diagnosis not present

## 2018-01-29 DIAGNOSIS — M542 Cervicalgia: Secondary | ICD-10-CM | POA: Diagnosis not present

## 2018-01-29 DIAGNOSIS — M9901 Segmental and somatic dysfunction of cervical region: Secondary | ICD-10-CM | POA: Diagnosis not present

## 2018-01-29 DIAGNOSIS — M9903 Segmental and somatic dysfunction of lumbar region: Secondary | ICD-10-CM | POA: Diagnosis not present

## 2018-02-04 DIAGNOSIS — M9903 Segmental and somatic dysfunction of lumbar region: Secondary | ICD-10-CM | POA: Diagnosis not present

## 2018-02-04 DIAGNOSIS — M9901 Segmental and somatic dysfunction of cervical region: Secondary | ICD-10-CM | POA: Diagnosis not present

## 2018-02-04 DIAGNOSIS — M542 Cervicalgia: Secondary | ICD-10-CM | POA: Diagnosis not present

## 2018-02-10 DIAGNOSIS — M542 Cervicalgia: Secondary | ICD-10-CM | POA: Diagnosis not present

## 2018-02-10 DIAGNOSIS — M9901 Segmental and somatic dysfunction of cervical region: Secondary | ICD-10-CM | POA: Diagnosis not present

## 2018-02-10 DIAGNOSIS — M9903 Segmental and somatic dysfunction of lumbar region: Secondary | ICD-10-CM | POA: Diagnosis not present

## 2018-02-12 DIAGNOSIS — M9901 Segmental and somatic dysfunction of cervical region: Secondary | ICD-10-CM | POA: Diagnosis not present

## 2018-02-12 DIAGNOSIS — M9903 Segmental and somatic dysfunction of lumbar region: Secondary | ICD-10-CM | POA: Diagnosis not present

## 2018-02-12 DIAGNOSIS — M542 Cervicalgia: Secondary | ICD-10-CM | POA: Diagnosis not present

## 2018-02-17 DIAGNOSIS — M542 Cervicalgia: Secondary | ICD-10-CM | POA: Diagnosis not present

## 2018-02-17 DIAGNOSIS — M9903 Segmental and somatic dysfunction of lumbar region: Secondary | ICD-10-CM | POA: Diagnosis not present

## 2018-02-17 DIAGNOSIS — M9901 Segmental and somatic dysfunction of cervical region: Secondary | ICD-10-CM | POA: Diagnosis not present

## 2018-03-03 DIAGNOSIS — M9901 Segmental and somatic dysfunction of cervical region: Secondary | ICD-10-CM | POA: Diagnosis not present

## 2018-03-03 DIAGNOSIS — M9903 Segmental and somatic dysfunction of lumbar region: Secondary | ICD-10-CM | POA: Diagnosis not present

## 2018-03-03 DIAGNOSIS — M542 Cervicalgia: Secondary | ICD-10-CM | POA: Diagnosis not present

## 2018-04-16 DIAGNOSIS — M9901 Segmental and somatic dysfunction of cervical region: Secondary | ICD-10-CM | POA: Diagnosis not present

## 2018-04-16 DIAGNOSIS — M9903 Segmental and somatic dysfunction of lumbar region: Secondary | ICD-10-CM | POA: Diagnosis not present

## 2018-04-16 DIAGNOSIS — M542 Cervicalgia: Secondary | ICD-10-CM | POA: Diagnosis not present

## 2018-04-17 DIAGNOSIS — M542 Cervicalgia: Secondary | ICD-10-CM | POA: Diagnosis not present

## 2018-04-17 DIAGNOSIS — R2231 Localized swelling, mass and lump, right upper limb: Secondary | ICD-10-CM | POA: Diagnosis not present

## 2018-04-17 DIAGNOSIS — R2230 Localized swelling, mass and lump, unspecified upper limb: Secondary | ICD-10-CM | POA: Diagnosis not present

## 2018-04-17 DIAGNOSIS — M9903 Segmental and somatic dysfunction of lumbar region: Secondary | ICD-10-CM | POA: Diagnosis not present

## 2018-04-17 DIAGNOSIS — M62838 Other muscle spasm: Secondary | ICD-10-CM | POA: Diagnosis not present

## 2018-04-17 DIAGNOSIS — M9901 Segmental and somatic dysfunction of cervical region: Secondary | ICD-10-CM | POA: Diagnosis not present

## 2018-04-28 DIAGNOSIS — M62838 Other muscle spasm: Secondary | ICD-10-CM | POA: Diagnosis not present

## 2018-04-28 DIAGNOSIS — M542 Cervicalgia: Secondary | ICD-10-CM | POA: Diagnosis not present

## 2018-04-28 DIAGNOSIS — M47812 Spondylosis without myelopathy or radiculopathy, cervical region: Secondary | ICD-10-CM | POA: Diagnosis not present

## 2018-04-30 DIAGNOSIS — M67441 Ganglion, right hand: Secondary | ICD-10-CM | POA: Diagnosis not present

## 2018-04-30 DIAGNOSIS — M7989 Other specified soft tissue disorders: Secondary | ICD-10-CM | POA: Diagnosis not present

## 2018-04-30 DIAGNOSIS — M79641 Pain in right hand: Secondary | ICD-10-CM | POA: Diagnosis not present

## 2018-07-09 DIAGNOSIS — M9901 Segmental and somatic dysfunction of cervical region: Secondary | ICD-10-CM | POA: Diagnosis not present

## 2018-07-09 DIAGNOSIS — M9903 Segmental and somatic dysfunction of lumbar region: Secondary | ICD-10-CM | POA: Diagnosis not present

## 2018-07-09 DIAGNOSIS — M542 Cervicalgia: Secondary | ICD-10-CM | POA: Diagnosis not present

## 2018-07-15 DIAGNOSIS — M9901 Segmental and somatic dysfunction of cervical region: Secondary | ICD-10-CM | POA: Diagnosis not present

## 2018-07-15 DIAGNOSIS — M9903 Segmental and somatic dysfunction of lumbar region: Secondary | ICD-10-CM | POA: Diagnosis not present

## 2018-07-15 DIAGNOSIS — M542 Cervicalgia: Secondary | ICD-10-CM | POA: Diagnosis not present

## 2018-07-31 DIAGNOSIS — M542 Cervicalgia: Secondary | ICD-10-CM | POA: Diagnosis not present

## 2018-07-31 DIAGNOSIS — M9901 Segmental and somatic dysfunction of cervical region: Secondary | ICD-10-CM | POA: Diagnosis not present

## 2018-07-31 DIAGNOSIS — M9903 Segmental and somatic dysfunction of lumbar region: Secondary | ICD-10-CM | POA: Diagnosis not present

## 2018-08-01 DIAGNOSIS — J029 Acute pharyngitis, unspecified: Secondary | ICD-10-CM | POA: Diagnosis not present

## 2018-08-01 DIAGNOSIS — R6889 Other general symptoms and signs: Secondary | ICD-10-CM | POA: Diagnosis not present

## 2018-08-01 DIAGNOSIS — J302 Other seasonal allergic rhinitis: Secondary | ICD-10-CM | POA: Diagnosis not present

## 2018-09-25 ENCOUNTER — Telehealth: Payer: Self-pay

## 2018-09-25 ENCOUNTER — Other Ambulatory Visit: Payer: Self-pay

## 2018-09-25 DIAGNOSIS — Z20822 Contact with and (suspected) exposure to covid-19: Secondary | ICD-10-CM

## 2018-09-25 LAB — NOVEL CORONAVIRUS, NAA: SARS-CoV-2, NAA: NOT DETECTED

## 2018-09-25 NOTE — Addendum Note (Signed)
Addended by: Redmond Baseman on: 09/25/2018 02:43 PM   Modules accepted: Orders

## 2018-09-25 NOTE — Telephone Encounter (Addendum)
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

## 2018-09-25 NOTE — Telephone Encounter (Signed)
[place] [facility] placed orders for COVID 19 testing.States she works for [employer] and was offered testing through them- will call them to schedule testing.

## 2018-09-28 ENCOUNTER — Ambulatory Visit (INDEPENDENT_AMBULATORY_CARE_PROVIDER_SITE_OTHER): Payer: 59 | Admitting: Family Medicine

## 2018-09-28 ENCOUNTER — Encounter: Payer: Self-pay | Admitting: Family Medicine

## 2018-09-28 VITALS — HR 81 | Temp 98.1°F | Wt 183.0 lb

## 2018-09-28 DIAGNOSIS — H6690 Otitis media, unspecified, unspecified ear: Secondary | ICD-10-CM | POA: Diagnosis not present

## 2018-09-28 DIAGNOSIS — N939 Abnormal uterine and vaginal bleeding, unspecified: Secondary | ICD-10-CM | POA: Insufficient documentation

## 2018-09-28 DIAGNOSIS — J302 Other seasonal allergic rhinitis: Secondary | ICD-10-CM | POA: Diagnosis not present

## 2018-09-28 HISTORY — DX: Abnormal uterine and vaginal bleeding, unspecified: N93.9

## 2018-09-28 LAB — NOVEL CORONAVIRUS, NAA: SARS-CoV-2, NAA: NOT DETECTED

## 2018-09-28 MED ORDER — AZITHROMYCIN 250 MG PO TABS: ORAL_TABLET | ORAL | 0 refills | Status: DC

## 2018-09-28 MED ORDER — LEVOCETIRIZINE DIHYDROCHLORIDE 5 MG PO TABS: 5.0000 mg | ORAL_TABLET | Freq: Every evening | ORAL | 2 refills | Status: DC

## 2018-09-28 NOTE — Progress Notes (Signed)
Virtual Visit via Video Note  I connected with Nunzio Cobbs on 09/28/18 at  2:00 PM EDT by a video enabled telemedicine application and verified that I am speaking with the correct person using two identifiers.  Location: Patient: In her home Provider: LBPCMila Merry    I discussed the limitations of evaluation and management by telemedicine and the availability of in person appointments. The patient expressed understanding and agreed to proceed.  History of Present Illness: This is a 38 year old female who requests virtual visit today to discuss several symptoms.  She has had 4 to 5 days of postnasal drainage and bilateral ear pain.  She has little nasal drainage that is clear.  She has not had a fever.  She has had a sore throat.  No cough, no shortness of breath.  She has been taking guaifenesin PE, drinking hot tea, using Elderberry syrup and vitamin C.  She reports that she gets an ear infection about this time every year.  She has a history of seasonal allergies but has not been taking any medication for these.  She was previously on Xyzal but stopped this as was instructed by urgent care last year when she had an ear infection.  She is used Flonase and Nasonex in the past but after prolonged use these caused decreased sense of taste.  She is awaiting results for COVID-19 testing.  She had a positive sick contact at work.  She has been quarantining at home with her family since being tested.   Past Medical History:  Diagnosis Date  . GERD (gastroesophageal reflux disease)    during pregnancy and before chole   Past Surgical History:  Procedure Laterality Date  . CHOLECYSTECTOMY  09/23/13  . IRRIGATION AND DEBRIDEMENT SEBACEOUS CYST     Family History  Problem Relation Age of Onset  . Stroke Mother   . Hypertension Mother   . Hyperlipidemia Father   . Diabetes Paternal Uncle   . Diabetes Paternal Grandmother   . Cancer Paternal Grandfather 106       stomach cancer  . Alcohol  abuse Paternal Grandfather    Social History   Tobacco Use  . Smoking status: Never Smoker  . Smokeless tobacco: Never Used  Substance Use Topics  . Alcohol use: No  . Drug use: No    Observations/Objective: The patient is alert and answers questions appropriately.  Visible skin is unremarkable.  She is normally conversive without shortness of breath.  She does not sound congested.  Mood and affect are appropriate. Pulse 81   Temp 98.1 F (36.7 C) (Oral)   Wt 183 lb (83 kg)   SpO2 98%   BMI 27.83 kg/m  Wt Readings from Last 3 Encounters:  09/28/18 183 lb (83 kg)  10/11/16 184 lb (83.5 kg)  10/06/16 179 lb (81.2 kg)    Assessment and Plan: 1. Seasonal allergic rhinitis, unspecified trigger -Discussed use of antihistamine and encouraged daily use while she is having a lot of thin drainage.  Instructed her to stop this if drainage becomes thick and she develops a lot of nasal congestion. - levocetirizine (XYZAL) 5 MG tablet; Take 1 tablet (5 mg total) by mouth every evening.  Dispense: 30 tablet; Refill: 2  2. Acute otitis media, unspecified otitis media type -Chart reviewed and last acute otitis media was 11/28/2017.  Patient reports that symptoms are the same and so I am going to go ahead and cover for possible bacterial infection with an antibiotic.  She has sulfa and penicillin allergies. - azithromycin (ZITHROMAX) 250 MG tablet; Take 2 tabs PO x 1 dose, then 1 tab PO QD x 4 days  Dispense: 6 tablet; Refill: 0  -Return to clinic/UC/ER precautions reviewed  Olean Reeeborah Macie Baum, FNP-BC  [facility] [place], [facility]  09/28/2018 2:24 PM   Follow Up Instructions: AVS mailed to patient's address on file   I discussed the assessment and treatment plan with the patient. The patient was provided an opportunity to ask questions and all were answered. The patient agreed with the plan and demonstrated an understanding of the instructions.   The  patient was advised to call back or seek an in-person evaluation if the symptoms worsen or if the condition fails to improve as anticipated.    Emi Belfasteborah B Dejane Scheibe, FNP

## 2018-09-28 NOTE — Patient Instructions (Signed)
Hi Paula Page,  It was good to see you today for your virtual visit.  I hope that about the time you receive these papers you are  feeling much better.  You were given an antibiotic, azithromycin, to treat a presumed ear infection.  I have also recommended that you resume taking your levocetirizine and sent in a prescription for this as well.  Please see additional information below regarding ear infections.  Please call the office if you have any questions or concerns.  Warm regards,  Deboraha Sprang, FNP-BC   Otitis Media, Adult  Otitis media means that the middle ear is red and swollen (inflamed) and full of fluid. The condition usually goes away on its own. Follow these instructions at home:  Take over-the-counter and prescription medicines only as told by your doctor.  If you were prescribed an antibiotic medicine, take it as told by your doctor. Do not stop taking the antibiotic even if you start to feel better.  Keep all follow-up visits as told by your doctor. This is important. Contact a doctor if:  You have bleeding from your nose.  There is a lump on your neck.  You are not getting better in 5 days.  You feel worse instead of better. Get help right away if:  You have pain that is not helped with medicine.  You have swelling, redness, or pain around your ear.  You get a stiff neck.  You cannot move part of your face (paralyzed).  You notice that the bone behind your ear hurts when you touch it.  You get a very bad headache. Summary  Otitis media means that the middle ear is red, swollen, and full of fluid.  This condition usually goes away on its own. In some cases, treatment may be needed.  If you were prescribed an antibiotic medicine, take it as told by your doctor. This information is not intended to replace advice given to you by your health care provider. Make sure you discuss any questions you have with your health care provider. Document Released:  10/16/2007 Document Revised: 05/20/2016 Document Reviewed: 05/20/2016 Elsevier Interactive Patient Education  2019 [employer].

## 2018-09-29 NOTE — Telephone Encounter (Signed)
covid test result negative.  Result routed to Dr Karyl Kinnier, [facility].  LM

## 2018-09-30 ENCOUNTER — Telehealth: Payer: Self-pay | Admitting: *Deleted

## 2018-09-30 NOTE — Telephone Encounter (Signed)
Please call patient and let her know that if she is not running a fever, she can return to work.

## 2018-09-30 NOTE — Telephone Encounter (Signed)
Patient called stating that she received her Covid-19 results yesterday and it was negative. Patient stated that she is still on her antibiotic Patient stated that she does not have a fever or sore throat, but still has some ear pain from the sinus drainage. Patient wants to know when it would be safe for her to go back to work?

## 2018-10-01 NOTE — Telephone Encounter (Signed)
Left message for patient to call back to advise

## 2018-10-06 NOTE — Telephone Encounter (Signed)
Spoke with patient. Patient will be going to work tomorrow-10/07/2018, she had to wait to be released by HR department even if the results are negative. Just FYI for the chart

## 2018-11-09 ENCOUNTER — Telehealth: Payer: Self-pay | Admitting: *Deleted

## 2018-11-09 DIAGNOSIS — Z20822 Contact with and (suspected) exposure to covid-19: Secondary | ICD-10-CM

## 2018-11-09 NOTE — Telephone Encounter (Signed)
[facility]- request COVID testing  Symptomatic and exposure

## 2018-11-10 ENCOUNTER — Other Ambulatory Visit: Payer: Self-pay

## 2018-11-10 DIAGNOSIS — Z20822 Contact with and (suspected) exposure to covid-19: Secondary | ICD-10-CM

## 2018-11-17 ENCOUNTER — Telehealth: Payer: Self-pay

## 2018-11-17 LAB — NOVEL CORONAVIRUS, NAA: SARS-CoV-2, NAA: NOT DETECTED

## 2018-11-17 NOTE — Telephone Encounter (Signed)
Pt said she has a sore throat not scratchy since 11/07/18.not getting any better, rt ear is burning and allergy med is not helping. Pt has H/A and sinus drainage. No other covid symptoms including fever.Pt has not traveled but is waiting on covid testing results that were done on 11/10/18. Pt will go to [facility] walk in for eval. FYI to Dr Silvio Pate.

## 2018-11-18 NOTE — Telephone Encounter (Signed)
Spoke to pt. She got her Covid results back late last night. Went to UC and they dx her with ear infection and sinus infection. BP was good.

## 2018-11-18 NOTE — Telephone Encounter (Signed)
Please check to see how she made out 

## 2018-12-16 ENCOUNTER — Telehealth: Payer: Self-pay | Admitting: *Deleted

## 2018-12-16 DIAGNOSIS — H9209 Otalgia, unspecified ear: Secondary | ICD-10-CM

## 2018-12-16 NOTE — Telephone Encounter (Signed)
Spoke to pt. She appreciated the referral. 

## 2018-12-16 NOTE — Telephone Encounter (Signed)
Patient called stating that she had an ear infection back in May and was put on an antibiotic. Patient stated then again 11/07/18 she had to go to an Urgent Care with the same problem and was put on an antibiotic again. Patient stated that the problem has not resolved itself even after the second antibiotic. Patient stated that she is still having discomfort in her throat and ear and her voice gets tired and she feels that she is going to lose her voice. Patient is wondering if she shouldn't be referred to a specialist to see why she continues to have these problems?

## 2018-12-16 NOTE — Telephone Encounter (Signed)
Please let her know that I have put in a referral for ENT--though she could probably set up an appt on her own. She should hear from our staff about it within the next few days

## 2019-02-09 ENCOUNTER — Telehealth: Payer: Self-pay | Admitting: *Deleted

## 2019-02-09 NOTE — Telephone Encounter (Signed)
Patient called stating that she has a sore throat and it feels like it is on fire. Patient stated that her she is having some stomach pain and it feels upset. Patient stated that she has not had a fever. . Patient wants to come to the office and have a strep test done. 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 Paula Page that because of her symptoms we are not able to bring her into the office. Patient stated that she wants a strep test done and will go to [facility] when she gets off work today.

## 2019-02-10 NOTE — Telephone Encounter (Signed)
Okay Maybe just check on her again tomorrow

## 2019-02-10 NOTE — Telephone Encounter (Signed)
Please check and see how she has made out

## 2019-02-10 NOTE — Telephone Encounter (Addendum)
Spoke to pt. She did go to [facility]. On a Z-Pac, told her to take Tylenol and use a throat spray. She said she feels worse today. I advised her that it may take a few days to feel better but I would forward this message to Dr Silvio Pate.

## 2019-02-11 NOTE — Telephone Encounter (Signed)
Spoke to pt. She said her throat is a bit better today but she is super congested. She is wondering if she had the flu. She will let us know Monday if she does not see anymore improvement.

## 2019-07-29 ENCOUNTER — Other Ambulatory Visit: Payer: Self-pay | Admitting: Family Medicine

## 2019-07-29 DIAGNOSIS — M5412 Radiculopathy, cervical region: Secondary | ICD-10-CM

## 2019-08-04 ENCOUNTER — Telehealth: Payer: Self-pay

## 2019-08-04 ENCOUNTER — Other Ambulatory Visit: Payer: Self-pay

## 2019-08-04 ENCOUNTER — Ambulatory Visit
Admission: RE | Admit: 2019-08-04 | Discharge: 2019-08-04 | Disposition: A | Discharge status: Home / Self Care | Payer: 59 | Source: Ambulatory Visit | Attending: Family Medicine | Admitting: Family Medicine

## 2019-08-04 DIAGNOSIS — M5412 Radiculopathy, cervical region: Secondary | ICD-10-CM | POA: Diagnosis not present

## 2019-08-04 NOTE — Telephone Encounter (Signed)
Noted Will await the evaluation at urgent care

## 2019-08-04 NOTE — Telephone Encounter (Signed)
Pt called and said she knows she spoke with someone from [facility] earlier but pt cannot remember anything that was said. Pt asked what we discussed and I advised pt per note from 1:12 pm. Pt voiced understanding and will tell UC that she is having problems remembering short term as well.pt will stay at UC to be seen. FYI to Dr Alphonsus Sias and Carollee Herter CMA.

## 2019-08-04 NOTE — Telephone Encounter (Signed)
Pt left v/m at 12:40 pm that pt has muscle weakness and pain in entire lt arm from shoulder to finger tips that started on 01/03/20; pt also has tightness in lt hand. On 08/03/19 also started with swelling in feet and hands. No CP. I called pt back and at 1:15 pm pt was at [facility] on [address] to be evaluated. Pt will cb if needed. FYI to Dr Alphonsus Sias.

## 2019-08-04 NOTE — Telephone Encounter (Signed)
Please check on her tomorrow 

## 2019-08-05 NOTE — Telephone Encounter (Signed)
Spoke with patient today. Patient went to [facility] on [address] in [facility] yesterday-08/04/19. Patient's b/p was 156/92. Patient states doctor there did not check anything- no lab work, no urine test, no imaging. Patient mentioned the memory issue and even had a spell while there while talking to the provider and her "mind went blank." Provider did not give patient any home care instructions or anything. I asked patient how she was feeling today and she said she did not have anymore left arm pain or numbness but her finger tips on the left hand feel cold at times. She has some leg aches and a mild headache off and on, also some pain in the back of her head. No chest pain, chest pressure, blurred vision, SOB, dizziness, or syncope feeling. Patient states she feels like she needs brain break after a few conversations with people. She bought a b/p cuff and her b/p earlier today was 140/82. She urinated about 9 times today she thinks. She is working from home today.  How should patient proceed at this time?

## 2019-08-05 NOTE — Telephone Encounter (Signed)
Patient advised. Appointment made for 08/06/19. ER precautions given to the patient in case symptoms change or worsen.

## 2019-08-05 NOTE — Telephone Encounter (Signed)
If she has ongoing concerns, she should set up an appointment here with me so I can do a full assessment and decide if testing is needed

## 2019-08-06 ENCOUNTER — Emergency Department
Admission: EM | Admit: 2019-08-06 | Discharge: 2019-08-06 | Disposition: A | Discharge status: Home / Self Care | Payer: 59 | Attending: Emergency Medicine | Admitting: Emergency Medicine

## 2019-08-06 ENCOUNTER — Other Ambulatory Visit: Payer: Self-pay

## 2019-08-06 ENCOUNTER — Ambulatory Visit: Payer: 59 | Admitting: Internal Medicine

## 2019-08-06 ENCOUNTER — Encounter: Payer: Self-pay | Admitting: Internal Medicine

## 2019-08-06 ENCOUNTER — Emergency Department: Payer: 59

## 2019-08-06 VITALS — BP 134/82 | HR 77 | Temp 97.9°F | Ht 69.0 in | Wt 197.0 lb

## 2019-08-06 DIAGNOSIS — Z79899 Other long term (current) drug therapy: Secondary | ICD-10-CM | POA: Diagnosis not present

## 2019-08-06 DIAGNOSIS — R0789 Other chest pain: Secondary | ICD-10-CM

## 2019-08-06 DIAGNOSIS — R202 Paresthesia of skin: Secondary | ICD-10-CM | POA: Insufficient documentation

## 2019-08-06 DIAGNOSIS — M79602 Pain in left arm: Secondary | ICD-10-CM

## 2019-08-06 DIAGNOSIS — R413 Other amnesia: Secondary | ICD-10-CM | POA: Diagnosis not present

## 2019-08-06 LAB — BASIC METABOLIC PANEL
Anion gap: 8 (ref 5–15)
BUN: 15 mg/dL (ref 6–20)
CO2: 28 mmol/L (ref 22–32)
Calcium: 8.9 mg/dL (ref 8.9–10.3)
Chloride: 104 mmol/L (ref 98–111)
Creatinine, Ser: 0.96 mg/dL (ref 0.44–1.00)
GFR calc Af Amer: >60 mL/min (ref >60)
GFR calc non Af Amer: >60 mL/min (ref >60)
Glucose, Bld: 107 mg/dL — ABNORMAL HIGH (ref 70–99)
Potassium: 3.4 mmol/L — ABNORMAL LOW (ref 3.5–5.1)
Sodium: 140 mmol/L (ref 135–145)

## 2019-08-06 LAB — VITAMIN B12: Vitamin B-12: 485 pg/mL (ref 211–911)

## 2019-08-06 LAB — CBC
HCT: 38.7 % (ref 36.0–46.0)
Hemoglobin: 12.4 g/dL (ref 12.0–15.0)
MCH: 27.1 pg (ref 26.0–34.0)
MCHC: 32 g/dL (ref 30.0–36.0)
MCV: 84.5 fL (ref 80.0–100.0)
Platelets: 287 10*3/uL (ref 150–400)
RBC: 4.58 MIL/uL (ref 3.87–5.11)
RDW: 14.2 % (ref 11.5–15.5)
WBC: 8.3 10*3/uL (ref 4.0–10.5)
nRBC: 0 % (ref 0.0–0.2)

## 2019-08-06 LAB — TROPONIN I (HIGH SENSITIVITY): Troponin I (High Sensitivity): 3 ng/L (ref <18)

## 2019-08-06 LAB — T4, FREE: Free T4: 0.95 ng/dL (ref 0.60–1.60)

## 2019-08-06 MED ORDER — SODIUM CHLORIDE 0.9% FLUSH: 3.0000 mL | Freq: Once | INTRAVENOUS | Status: DC

## 2019-08-06 NOTE — ED Triage Notes (Signed)
Pt c/o having elevated b/p for the past 2-3 days 160's/90's without a hx of HTN. Pt c/o chest pain radiating into her left arm with the pain. Pt is in NAD. Ambulatory to triage with a steady gait
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
## 2019-08-06 NOTE — Assessment & Plan Note (Signed)
Seems likely to be related to the baclofen She will stay off this and monitor Will add B12 and thyroid for screening Observe for now

## 2019-08-06 NOTE — ED Notes (Signed)
Pt c/o tightness in her chest and left arm on and off for the past 2 weeks. Pt states that she has the pain when her blood pressure is elevated. Pt denies hx/o HTN but states that for the past few weeks BP has been higher than normal. Pt is in NAD. Pt is A & O and can ambulate without difficulty.

## 2019-08-06 NOTE — Progress Notes (Signed)
Subjective:    Patient ID: Paula Page, female    DOB: 22-Sep-1980, 39 y.o.   MRN: [id number]  HPI Here due to concerns about memory loss This visit occurred during the SARS-CoV-2 public health emergency.  Safety protocols were in place, including screening questions prior to the visit, additional usage of staff PPE, and extensive cleaning of exam room while observing appropriate contact time as indicated for disinfecting solutions.   Started with pain and tightness in shoulders/neck in February Will usually see chiropractor No better and noticed tightness (lump) in neck muscles Went to urgent care---got prednisone and muscle relaxer Only took the prednisone morning and afternoon--but stopped due to hot flashes  Headaches then started due to the neck pain  Having left arm pain, memory problems---so went to ER 2 days ago Trouble remembering conversations or to say what she wanted to BP 156/91 at urgent care ([facility] in [place]) Told it could be the medicaiton  Had been referred to physiatrist from original urgent care appt CT of neck ordered---normal  Last night---BP 168/92 This morning was driving to work and started with upper left arm/shoulder pain Hand felt like it was going to sleep Stopped and checked BP--- 140/something (but had headache) Decided to stop at [facility] ER Records reviewed ---labs, etc were okay  Feels her memory is some better since stopping baclofen Had been given this at the original visit Not sleepy from this so didn't think it was causing the problems  No recent alcohol No other substances  Past issues with short term memory Took gingko in the past   Current Outpatient Medications on File Prior to Visit  Medication Sig Dispense Refill  . fluticasone (FLONASE) 50 MCG/ACT nasal spray Place 1 spray into both nostrils daily as needed for allergies or rhinitis.    Marland Kitchen GINSENG PO Take by mouth.    . levocetirizine (XYZAL) 5 MG tablet Take 1 tablet (5  mg total) by mouth every evening. 30 tablet 2   No current facility-administered medications on file prior to visit.    Allergies  Allergen Reactions  . Diclegis [Doxylamine-Pyridoxine] Hives  . Other Hives  . Penicillins Hives  . Sulfa Antibiotics Hives    Past Medical History:  Diagnosis Date  . GERD (gastroesophageal reflux disease)    during pregnancy and before chole    Past Surgical History:  Procedure Laterality Date  . CHOLECYSTECTOMY  09/23/13  . IRRIGATION AND DEBRIDEMENT SEBACEOUS CYST      Family History  Problem Relation Age of Onset  . Stroke Mother   . Hypertension Mother   . Hyperlipidemia Father   . Diabetes Paternal Uncle   . Diabetes Paternal Grandmother   . Cancer Paternal Grandfather 26       stomach cancer  . Alcohol abuse Paternal Grandfather     Social History   Socioeconomic History  . Marital status: Single    Spouse name: Not on file  . Number of children: 3  . Years of education: Not on file  . Highest education level: Not on file  Occupational History  . 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 Smoking status: Never Smoker  . Smokeless tobacco: Never Used  Substance and Sexual Activity  . Alcohol use: No  . Drug use: No  . Sexual activity: Not on file  Other Topics Concern  . Not on file  Social History Narrative   Single   3 children  2 different fathers --both involved in care   Social Determinants of Health   Financial Resource Strain:   . Difficulty of Paying Living Expenses:   Food Insecurity:   . Worried About Charity fundraiser in the Last Year:   . Arboriculturist in the Last Year:   Transportation Needs:   . Film/video editor (Medical):   Marland Kitchen Lack of Transportation (Non-Medical):   Physical Activity:   . Days of Exercise per Week:   . Minutes of Exercise per Session:   Stress:   . Feeling of Stress :   Social Connections:   . Frequency of Communication with  Friends and Family:   . Frequency of Social Gatherings with Friends and Family:   . Attends Religious Services:   . Active Member of Clubs or Organizations:   . Attends Archivist Meetings:   Marland Kitchen Marital Status:   Intimate Partner Violence:   . Fear of Current or Ex-Partner:   . Emotionally Abused:   Marland Kitchen Physically Abused:   . Sexually Abused:    Review of Systems No illness No cough or SOB No recent injury    Objective:   Physical Exam  Constitutional: She is oriented to person, place, and time. She appears well-developed. No distress.  Neurological: She is alert and oriented to person, place, and time.  President--- "Edmon Crape, Daisy Floro, Barack Obama" [phone] D-[id number] Recall 3/3 Clock draw perfect           Assessment & Plan:

## 2020-01-27 ENCOUNTER — Telehealth: Payer: Self-pay

## 2020-01-27 NOTE — Telephone Encounter (Signed)
If no fever or abdominal pain---probably should just try to wait it out. She can eat whatever appeals to her (fatty foods may reduce diarrhea) --and keep up with any fluid losses There has been some Gi illness around--not likely from the yogurt

## 2020-01-27 NOTE — Telephone Encounter (Signed)
Was eating yogurt Tuesday and a few minutes later started with diarrhea. Has had diarrhea since. Eating BRAT diet and probiotics with no real relief. Asking what else she should do. Please advise [phone].

## 2020-01-27 NOTE — Telephone Encounter (Signed)
Spoke to pt. She seems to think it was the yogurt from [employer]. She bought some Imodium and took it. I advised her if it was not any better in a few days to let us know. She also said she did not have a gallbladder and was starting to think she was having issues with dairy.

## 2020-05-15 ENCOUNTER — Ambulatory Visit (INDEPENDENT_AMBULATORY_CARE_PROVIDER_SITE_OTHER): Payer: 59 | Admitting: Internal Medicine

## 2020-05-15 ENCOUNTER — Encounter: Payer: Self-pay | Admitting: Internal Medicine

## 2020-05-15 ENCOUNTER — Telehealth: Payer: Self-pay | Admitting: *Deleted

## 2020-05-15 ENCOUNTER — Other Ambulatory Visit: Payer: Self-pay

## 2020-05-15 VITALS — BP 104/70 | HR 82 | Temp 97.6°F | Ht 69.0 in | Wt 211.0 lb

## 2020-05-15 DIAGNOSIS — R109 Unspecified abdominal pain: Secondary | ICD-10-CM | POA: Diagnosis not present

## 2020-05-15 DIAGNOSIS — R1011 Right upper quadrant pain: Secondary | ICD-10-CM | POA: Diagnosis not present

## 2020-05-15 LAB — POC URINALSYSI DIPSTICK (AUTOMATED)
Bilirubin, UA: NEGATIVE
Glucose, UA: NEGATIVE
Leukocytes, UA: NEGATIVE
Nitrite, UA: NEGATIVE
Protein, UA: POSITIVE — AB
Spec Grav, UA: 1.02 (ref 1.010–1.025)
Urobilinogen, UA: 0.2 U/dL
pH, UA: 7 (ref 5.0–8.0)

## 2020-05-15 MED ORDER — HYDROCODONE-ACETAMINOPHEN 5-325 MG PO TABS: 1.0000 | ORAL_TABLET | Freq: Four times a day (QID) | ORAL | 0 refills | Status: DC | PRN

## 2020-05-15 NOTE — Assessment & Plan Note (Addendum)
No gallbladder Pain pattern with flank to groin is worrisome for kidney stone--though she hasn't had before No leuks or nitrites on urinalysis so I don't think this is infection Will check CT scan Increase fluids Consider tamsulosin Will give some norco ER tonight if worsens

## 2020-05-15 NOTE — Telephone Encounter (Signed)
Patient called stating that she thinks that she may have a kidney stone. Patient stated that she does not have a history of kidney stones, but has had gallstones in the past. Patient stated that she started about two weeks ago with right side low back pain. Patient stated that the pain got worse last night and the pain level at one point was a 9. Patient stated that her urine is cloudy and she does have some urine urgency. Patient denies any blood in her urine. Patient denies a fever. Patient stated that she did take some tylenol which has helped some because she was able to go to work today. No appointments available at the office today. Patient scheduled for an appointment with Dr. Alphonsus Sias tomorrow at 9:30 am. Patient was given ER precautions and she verbalized understanding. Patient had a negative covid screening.

## 2020-05-15 NOTE — Telephone Encounter (Signed)
I tried to call pt to offer [phone] appt and there was no answer and VM was full.

## 2020-05-15 NOTE — Telephone Encounter (Signed)
Pt called back and scheduled at 415.

## 2020-05-15 NOTE — Progress Notes (Signed)
Subjective:    Patient ID: Paula Page, female    DOB: 10-Apr-1981, 40 y.o.   MRN: [id number]  HPI Here due to flank pain This visit occurred during the SARS-CoV-2 public health emergency.  Safety protocols were in place, including screening questions prior to the visit, additional usage of staff PPE, and extensive cleaning of exam room while observing appropriate contact time as indicated for disinfecting solutions.   Started with pain ~12/20---when standing at counter Searing pain in right flank Next day--the pain wrapped around to groin Wonders if it could be menstrual--but her period had been 2 weeks before Lasted 3 days--then resolved Then 2 days ago---stabbing right flank pain started again Nausea Can't lie on that side Radiating to the groin again yesterday  Urine looks cloudy--and having urgency No blood---but known history of microscopic hematuria since age 52  Tried tylenol--helped her sleep  Current Outpatient Medications on File Prior to Visit  Medication Sig Dispense Refill  . GINSENG PO Take by mouth.    . levocetirizine (XYZAL) 5 MG tablet Take 1 tablet (5 mg total) by mouth every evening. 30 tablet 2   No current facility-administered medications on file prior to visit.    Allergies  Allergen Reactions  . Diclegis [Doxylamine-Pyridoxine] Hives  . Other Hives  . Penicillins Hives  . Sulfa Antibiotics Hives    Past Medical History:  Diagnosis Date  . GERD (gastroesophageal reflux disease)    during pregnancy and before chole    Past Surgical History:  Procedure Laterality Date  . CHOLECYSTECTOMY  09/23/13  . IRRIGATION AND DEBRIDEMENT SEBACEOUS CYST      Family History  Problem Relation Age of Onset  . Stroke Mother   . Hypertension Mother   . Hyperlipidemia Father   . Diabetes Paternal Uncle   . Diabetes Paternal Grandmother   . Cancer Paternal Grandfather 109       stomach cancer  . Alcohol abuse Paternal Grandfather     Social History    Socioeconomic History  . Marital status: Single    Spouse name: Not on file  . Number of children: 3  . Years of education: Not on file  . Highest education level: Not on file  Occupational History  . 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 Smoking status: Never Smoker  . Smokeless tobacco: Never Used  Substance and Sexual Activity  . Alcohol use: No  . Drug use: No  . Sexual activity: Not on file  Other Topics Concern  . Not on file  Social History Narrative   Single   3 children   2 different fathers --both involved in care   Social Determinants of Health   Financial Resource Strain: Not on file  Food Insecurity: Not on file  Transportation Needs: Not on file  Physical Activity: Not on file  Stress: Not on file  Social Connections: Not on file  Intimate Partner Violence: Not on file   Review of Systems No fever Bowels moving fine On medication is elderberry, echinacea, xyzal    Objective:   Physical Exam Constitutional:      Appearance: Normal appearance.  Pulmonary:     Effort: Pulmonary effort is normal.     Breath sounds: Normal breath sounds. No wheezing or rales.  Abdominal:     Palpations: Abdomen is soft.     Tenderness: There is right CVA tenderness.     Comments: Some suprapubic tenderness  Neurological:  Mental Status: She is alert.            Assessment & Plan:

## 2020-05-15 NOTE — Telephone Encounter (Signed)
You can offer 4:15 today if she can make it

## 2020-05-16 ENCOUNTER — Ambulatory Visit
Admission: RE | Admit: 2020-05-16 | Discharge: 2020-05-16 | Disposition: A | Discharge status: Home / Self Care | Payer: 59 | Source: Ambulatory Visit | Attending: Internal Medicine | Admitting: Internal Medicine

## 2020-05-16 ENCOUNTER — Ambulatory Visit: Payer: 59 | Admitting: Internal Medicine

## 2020-05-16 DIAGNOSIS — R1011 Right upper quadrant pain: Secondary | ICD-10-CM

## 2020-06-21 ENCOUNTER — Telehealth: Payer: Self-pay | Admitting: *Deleted

## 2020-06-21 DIAGNOSIS — J302 Other seasonal allergic rhinitis: Secondary | ICD-10-CM

## 2020-06-21 NOTE — Telephone Encounter (Signed)
Tried to call patient back and got her voicemail. Unable to leave a message because her mailbox was full.

## 2020-06-21 NOTE — Telephone Encounter (Signed)
Patient called stating that she saw Dr. Alphonsus Sias last month for pain and was given Hydrocodone. Patient stated that she took a Hydrocodone last Thursday or Friday night. Patient stated after taking the hydrocodone she had an allergic reaction and broke out all over with hives. Patient stated that she has been taking her allergy medication Xyzal which has helped some but took her last one last night. Patient stated that the hives have gotten smaller but she is still itching. Patient stated since she is out of Xyzal she is probably going to take Benadryl tonight because she goes to bed. . Patient stated that some time ago she had an allergic reaction to sulfa and took [place] which helped. . Patient stated that once before when she had hives she was given Mometasone 0.1% cream which helped the hives. Patient wants to know if she can get a script for her allergy pills and the Mometasone cream she was given before. Patient wanted to know if she should use hydrocortisone cream instead.  Pharmacy [facility]

## 2020-06-21 NOTE — Telephone Encounter (Signed)
Patient notified as instructed by telephone and verbalized understanding. Patient stated that she would like for this message to go back to Dr. Alphonsus Sias so that he will be aware of the allergic reaction that she had with the Hydrocodone.

## 2020-06-21 NOTE — Telephone Encounter (Signed)
Recommend benadryl and hyrdrocortisone cream OTC

## 2020-06-22 MED ORDER — LEVOCETIRIZINE DIHYDROCHLORIDE 5 MG PO TABS: 5.0000 mg | ORAL_TABLET | Freq: Every evening | ORAL | 3 refills | Status: DC

## 2020-06-22 MED ORDER — MOMETASONE FUROATE 0.1 % EX CREA: 1.0000 "application " | TOPICAL_CREAM | Freq: Every day | CUTANEOUS | 0 refills | Status: DC

## 2020-06-22 NOTE — Addendum Note (Signed)
Addended by: Eual Fines on: 06/22/2020 05:24 PM   Modules accepted: Orders

## 2020-06-22 NOTE — Telephone Encounter (Signed)
Spoke to pt. Sent in medications. Added hydrocodone to allergy list.

## 2020-06-22 NOTE — Telephone Encounter (Signed)
Please put the hydrocodone on her allergy list--hives (may affect any future narcotic Rx). Okay to refill xyzal for a year Can send 1 tube of mometasone as well

## 2020-11-06 ENCOUNTER — Other Ambulatory Visit: Payer: Self-pay

## 2020-11-06 ENCOUNTER — Ambulatory Visit
Admission: RE | Admit: 2020-11-06 | Discharge: 2020-11-06 | Disposition: A | Discharge status: Home / Self Care | Payer: 59 | Source: Ambulatory Visit | Attending: Obstetrics and Gynecology | Admitting: Obstetrics and Gynecology

## 2020-11-06 ENCOUNTER — Other Ambulatory Visit: Payer: Self-pay | Admitting: Obstetrics and Gynecology

## 2020-11-06 DIAGNOSIS — Z1231 Encounter for screening mammogram for malignant neoplasm of breast: Secondary | ICD-10-CM | POA: Diagnosis not present

## 2020-11-08 ENCOUNTER — Telehealth: Payer: Self-pay

## 2020-11-08 NOTE — Telephone Encounter (Signed)
Pt saw her GYN at [facility]. They checked her Vitamin D. Result came back low at 27.4 Pt said they suggested OTC supplement but did not say how much to take. Asking if Dr Alphonsus Sias can recommend what she should do for the Vitamin D. She uses [facility] [address] if he does a rx.

## 2020-11-09 NOTE — Telephone Encounter (Signed)
I would just recommend 1000 units OTC daily

## 2020-11-09 NOTE — Telephone Encounter (Signed)
Spoke to pt

## 2021-06-12 ENCOUNTER — Other Ambulatory Visit: Payer: Self-pay

## 2021-06-12 ENCOUNTER — Ambulatory Visit: Payer: 59 | Admitting: Urology

## 2021-06-12 ENCOUNTER — Encounter: Payer: Self-pay | Admitting: Urology

## 2021-06-12 ENCOUNTER — Other Ambulatory Visit
Admission: RE | Admit: 2021-06-12 | Discharge: 2021-06-12 | Disposition: A | Discharge status: Home / Self Care | Payer: 59 | Attending: Urology | Admitting: Urology

## 2021-06-12 ENCOUNTER — Other Ambulatory Visit: Payer: Self-pay | Admitting: *Deleted

## 2021-06-12 VITALS — BP 134/76 | HR 71 | Ht 69.0 in | Wt 204.0 lb

## 2021-06-12 DIAGNOSIS — R3129 Other microscopic hematuria: Secondary | ICD-10-CM

## 2021-06-12 DIAGNOSIS — R109 Unspecified abdominal pain: Secondary | ICD-10-CM | POA: Diagnosis not present

## 2021-06-12 DIAGNOSIS — R3121 Asymptomatic microscopic hematuria: Secondary | ICD-10-CM

## 2021-06-12 LAB — URINALYSIS, COMPLETE (UACMP) WITH MICROSCOPIC
Bilirubin Urine: NEGATIVE
Glucose, UA: NEGATIVE mg/dL
Ketones, ur: NEGATIVE mg/dL
Leukocytes,Ua: NEGATIVE
Nitrite: NEGATIVE
Protein, ur: NEGATIVE mg/dL
RBC / HPF: >50 RBC/hpf (ref 0–5)
Specific Gravity, Urine: 1.025 (ref 1.005–1.030)
pH: 7 (ref 5.0–8.0)

## 2021-06-12 NOTE — Progress Notes (Signed)
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  She was also having some right-sided flank pain at that time, but that has since completely resolved and she denies any complaints today.  She denies any gross hematuria or urinary symptoms.  She has a son who also has microscopic hematuria.  She had a CT in January 2022 that was benign with no evidence of nephrolithiasis or hydronephrosis.  Renal function is normal.  Urinalysis today is pending   PMH: Past Medical History:  Diagnosis Date   GERD (gastroesophageal reflux disease)    during pregnancy and before chole    Surgical History: Past Surgical History:  Procedure Laterality Date   CHOLECYSTECTOMY  09/23/13   IRRIGATION AND DEBRIDEMENT SEBACEOUS CYST       Family History: Family History  Problem Relation Age of Onset   Stroke Mother    Hypertension Mother    Hyperlipidemia Father    Diabetes Paternal Uncle    Diabetes Paternal Grandmother    Cancer Paternal Grandfather 32       stomach cancer   Alcohol abuse Paternal Grandfather     Social History:  reports that she has never smoked. She has never used smokeless tobacco. She reports that she does not drink alcohol and does not use drugs.  Physical Exam: BP 134/76    Pulse 71    Ht 5\' 9"  (1.753 m)    Wt 204 lb (92.5 kg)    BMI 30.13 kg/m    Constitutional:  Alert and oriented, No acute distress. Cardiovascular: No clubbing, cyanosis, or edema. Respiratory: Normal respiratory effort, no increased work of breathing. GI: Abdomen is soft, nontender, nondistended, no abdominal masses  Pertinent Imaging: I have personally viewed and interpreted the CT stone protocol dated  05/16/2020 that shows no nephrolithiasis or hydronephrosis.  Assessment & Plan:   41 year old female with a lifelong history of asymptomatic microscopic hematuria, as well as a recent episode of some right-sided flank pain that has since resolved completely.  She had a CT stone protocol in January 2022 that was benign with no stones or hydronephrosis.  We reviewed the AUA guidelines regarding microscopic hematuria and consideration of further evaluation with renal ultrasound and cystoscopy.  With her lifelong microscopic hematuria, and negative CT within the last year, I think deferring further evaluation is reasonable at this time.    If she has a recurrence of her flank pain consider CT, but can follow-up as needed with urology if continues to be asymptomatic   Paula Madrid, MD 06/12/2021  [name] [address], [place], [place] [address] [phone]

## 2021-06-28 ENCOUNTER — Other Ambulatory Visit: Payer: Self-pay | Admitting: Podiatry

## 2021-06-28 DIAGNOSIS — S93491D Sprain of other ligament of right ankle, subsequent encounter: Secondary | ICD-10-CM

## 2021-07-03 ENCOUNTER — Other Ambulatory Visit: Payer: Self-pay

## 2021-07-03 ENCOUNTER — Ambulatory Visit
Admission: RE | Admit: 2021-07-03 | Discharge: 2021-07-03 | Disposition: A | Discharge status: Home / Self Care | Payer: 59 | Source: Ambulatory Visit | Attending: Podiatry | Admitting: Podiatry

## 2021-07-03 DIAGNOSIS — S93491D Sprain of other ligament of right ankle, subsequent encounter: Secondary | ICD-10-CM | POA: Insufficient documentation

## 2021-07-11 ENCOUNTER — Other Ambulatory Visit: Payer: Self-pay | Admitting: Podiatry

## 2021-07-12 ENCOUNTER — Encounter: Payer: Self-pay | Admitting: Podiatry

## 2021-07-13 ENCOUNTER — Telehealth: Payer: Self-pay

## 2021-07-13 NOTE — Telephone Encounter (Signed)
Patient is schedule for surgery on right ankle on 3/16. Needs clearance filed out did not see appointment open for her to get done. Let her know that she would be contacted about appointment after reviewed.  ?

## 2021-07-13 NOTE — Telephone Encounter (Signed)
Tabitha, would you be willing to see this pt for a surgical clearance next week? Please look at her chart and let me know. I will contact her Monday as to whether she can be seen next week or wait for Dr Silvio Pate. ?

## 2021-07-16 NOTE — Telephone Encounter (Signed)
Spoke with patient. Patient is scheduled for 07/20/21-patient will bring the form with her for pre op ?

## 2021-07-19 NOTE — Discharge Instructions (Signed)
[place]  POST OPERATIVE INSTRUCTIONS FOR DR. Vickki Muff AND DR. [place]   Take your medication as prescribed.  Pain medication should be taken only as needed.  Keep the dressing clean, dry and intact.  Keep your foot elevated above the heart level for the first 48 hours.  Walking to the bathroom and brief periods of walking are acceptable, unless we have instructed you to be non-weight bearing.  Always wear your post-op shoe when walking.  Always use your crutches if you are to be non-weight bearing.  Do not take a shower. Baths are permissible as long as the foot is kept out of the water.   Every hour you are awake:  Bend your knee 15 times. Flex foot 15 times Massage calf 15 times  Call [facility] [phone]) if any of the following problems occur: You develop a temperature or fever. The bandage becomes saturated with blood. Medication does not stop your pain. Injury of the foot occurs. Any symptoms of infection including redness, odor, or red streaks running from wound.

## 2021-07-20 ENCOUNTER — Ambulatory Visit: Payer: 59 | Admitting: Family

## 2021-07-20 ENCOUNTER — Other Ambulatory Visit: Payer: Self-pay | Admitting: Family

## 2021-07-20 ENCOUNTER — Other Ambulatory Visit: Payer: Self-pay

## 2021-07-20 ENCOUNTER — Ambulatory Visit (INDEPENDENT_AMBULATORY_CARE_PROVIDER_SITE_OTHER)
Admission: RE | Admit: 2021-07-20 | Discharge: 2021-07-20 | Disposition: A | Discharge status: Home / Self Care | Payer: 59 | Source: Ambulatory Visit | Attending: Family | Admitting: Family

## 2021-07-20 ENCOUNTER — Other Ambulatory Visit (INDEPENDENT_AMBULATORY_CARE_PROVIDER_SITE_OTHER): Payer: 59

## 2021-07-20 DIAGNOSIS — Z01818 Encounter for other preprocedural examination: Secondary | ICD-10-CM | POA: Diagnosis not present

## 2021-07-20 LAB — COMPREHENSIVE METABOLIC PANEL
ALT: 12 U/L (ref 0–35)
AST: 16 U/L (ref 0–37)
Albumin: 4.2 g/dL (ref 3.5–5.2)
Alkaline Phosphatase: 63 U/L (ref 39–117)
BUN: 14 mg/dL (ref 6–23)
CO2: 28 mEq/L (ref 19–32)
Calcium: 9.2 mg/dL (ref 8.4–10.5)
Chloride: 102 mEq/L (ref 96–112)
Creatinine, Ser: 0.98 mg/dL (ref 0.40–1.20)
GFR: 72.04 mL/min (ref >60.00)
Glucose, Bld: 107 mg/dL — ABNORMAL HIGH (ref 70–99)
Potassium: 3.6 mEq/L (ref 3.5–5.1)
Sodium: 137 mEq/L (ref 135–145)
Total Bilirubin: 0.5 mg/dL (ref 0.2–1.2)
Total Protein: 7.1 g/dL (ref 6.0–8.3)

## 2021-07-20 LAB — CBC
HCT: 37.6 % (ref 36.0–46.0)
Hemoglobin: 12.2 g/dL (ref 12.0–15.0)
MCHC: 32.5 g/dL (ref 30.0–36.0)
MCV: 82.8 fl (ref 78.0–100.0)
Platelets: 295 10*3/uL (ref 150.0–400.0)
RBC: 4.54 Mil/uL (ref 3.87–5.11)
RDW: 15.5 % (ref 11.5–15.5)
WBC: 5.6 10*3/uL (ref 4.0–10.5)

## 2021-07-20 LAB — URINALYSIS, ROUTINE W REFLEX MICROSCOPIC
Bilirubin Urine: NEGATIVE
Ketones, ur: NEGATIVE
Leukocytes,Ua: NEGATIVE
Nitrite: NEGATIVE
Specific Gravity, Urine: 1.02 (ref 1.000–1.030)
Total Protein, Urine: NEGATIVE
Urine Glucose: NEGATIVE
Urobilinogen, UA: 0.2 (ref 0.0–1.0)
WBC, UA: NONE SEEN (ref 0–?)
pH: 6 (ref 5.0–8.0)

## 2021-07-20 LAB — PROTIME-INR
INR: 1 ratio (ref 0.8–1.0)
Prothrombin Time: 11.1 s (ref 9.6–13.1)

## 2021-07-20 LAB — POCT URINE PREGNANCY: Preg Test, Ur: NEGATIVE

## 2021-07-23 ENCOUNTER — Ambulatory Visit: Payer: 59 | Admitting: Family

## 2021-07-23 ENCOUNTER — Other Ambulatory Visit: Payer: Self-pay

## 2021-07-23 ENCOUNTER — Encounter: Payer: Self-pay | Admitting: Family

## 2021-07-23 ENCOUNTER — Telehealth: Payer: Self-pay | Admitting: *Deleted

## 2021-07-23 VITALS — BP 116/86 | HR 98 | Ht 69.0 in | Wt 202.0 lb

## 2021-07-23 DIAGNOSIS — R3121 Asymptomatic microscopic hematuria: Secondary | ICD-10-CM

## 2021-07-23 DIAGNOSIS — S93401A Sprain of unspecified ligament of right ankle, initial encounter: Secondary | ICD-10-CM

## 2021-07-23 DIAGNOSIS — Z01818 Encounter for other preprocedural examination: Secondary | ICD-10-CM

## 2021-07-23 DIAGNOSIS — M25371 Other instability, right ankle: Secondary | ICD-10-CM

## 2021-07-23 NOTE — Assessment & Plan Note (Signed)
Pending workup for pre op exam today. Workup in progress.  ?

## 2021-07-23 NOTE — Assessment & Plan Note (Signed)
Pending surgery pre op .  ?

## 2021-07-23 NOTE — Assessment & Plan Note (Signed)
Has been evaluated by urology in the past, benign findings. Chronic. Reviewed. Dose have hgn in urinalysis. ?

## 2021-07-23 NOTE — Progress Notes (Signed)
? ?Established Patient Office Visit ? ?Subjective:  ?Patient ID: Paula Page, female    DOB: 20-Jan-1981  Age: 41 y.o. MRN: [id number] ? ?CC:  ?Chief Complaint  ?Patient presents with  ? Pre-op Exam  ?  Pt having left ankle surgery.  ? ? ?HPI ?Paula Page is here today for pre op clearance.  ?Pt is without acute concerns. ? ?Patient is scheduled for pre op surgery on 07/26/21 for right ankle arthroscopy.  ?Dx: rupture of ligament of ankle, right ankle instability, sprain of anterior talofibular ligament of right ankle. MRI imaging reviewed in detail. Complete tear or rupture of the ATFL ankle ligament with ankle joint effusion present. ? ?pt has elected for surgery consisting of right ankle arthroscopy with extensive debridement and repair of ATFL ligament with internal brace placement ? ?Pt does have h/o microscopic chronic asymptomatic hematuria, seen by urologist in the past with benign findings.  ? ?Past Medical History:  ?Diagnosis Date  ? Abnormal uterine bleeding (AUB) 09/28/2018  ? GERD (gastroesophageal reflux disease)   ? during pregnancy and before chole  ? Wisdom teeth extracted   ? ? ?Past Surgical History:  ?Procedure Laterality Date  ? CHOLECYSTECTOMY  09/23/13  ? IRRIGATION AND DEBRIDEMENT SEBACEOUS CYST    ? ? ?Family History  ?Problem Relation Age of Onset  ? Stroke Mother   ? Hypertension Mother   ? Hyperlipidemia Father   ? Diabetes Paternal Uncle   ? Diabetes Paternal Grandmother   ? Cancer Paternal Grandfather 7  ?     stomach cancer  ? Alcohol abuse Paternal Grandfather   ? ? ?Social History  ? ?Socioeconomic History  ? Marital status: Single  ?  Spouse name: Not on file  ? Number of children: 3  ? Years of education: Not on file  ? Highest education level: Not on file  ?Occupational History  ? 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  Comment: [employer]  ?Tobacco Use  ? Smoking status: Never  ? Smokeless tobacco: Never  ?Vaping Use  ? Vaping Use: Never used  ?Substance and Sexual Activity   ? Alcohol use: No  ? Drug use: No  ? Sexual activity: Not on file  ?Other Topics Concern  ? Not on file  ?Social History Narrative  ? Single  ? 3 children  ? 2 different fathers --both involved in care  ? ?Social Determinants of Health  ? ?Financial Resource Strain: Not on file  ?Food Insecurity: Not on file  ?Transportation Needs: Not on file  ?Physical Activity: Not on file  ?Stress: Not on file  ?Social Connections: Not on file  ?Intimate Partner Violence: Not on file  ? ? ?Outpatient Medications Prior to Visit  ?Medication Sig Dispense Refill  ? ASHWAGANDHA PO Take by mouth at bedtime.    ? buPROPion (WELLBUTRIN XL) 150 MG 24 hr tablet Take 150 mg by mouth daily. (Patient not taking: Reported on 07/23/2021)    ? gabapentin (NEURONTIN) 100 MG capsule Take 100 mg by mouth 2 (two) times daily. (Patient not taking: Reported on 07/23/2021)    ? GINSENG PO Take by mouth. (Patient not taking: Reported on 07/12/2021)    ? levocetirizine (XYZAL) 5 MG tablet Take 1 tablet (5 mg total) by mouth every evening. (Patient not taking: Reported on 07/12/2021) 90 tablet 3  ? mometasone (ELOCON) 0.1 % cream Apply 1 application topically daily. (Patient not taking: Reported on 07/12/2021) 45 g 0  ? ?No facility-administered medications prior to  visit.  ? ? ?Allergies  ?Allergen Reactions  ? Diclegis [Doxylamine-Pyridoxine] Hives  ? Penicillins Hives  ? Sulfa Antibiotics Hives  ? Hydrocodone Rash and Hives  ? ? ?ROS ?Review of Systems ? ?Review of Systems  ?Respiratory:  Negative for shortness of breath.   ?Cardiovascular:  Negative for chest pain and palpitations.  ?Gastrointestinal:  Negative for constipation and diarrhea.  ?Genitourinary:  Negative for dysuria, frequency and urgency.  ?Musculoskeletal:  Negative for myalgias. Right ankle with pain on movement, currently wearing brace.  ?Psychiatric/Behavioral:  Negative for depression and suicidal ideas.   ?All other systems reviewed and are negative. ? ?  ?Objective:  ?  ?Physical  Exam ? ?Gen: NAD, resting comfortably ?HEENT: TMs normal bilaterally. OP clear. No thyromegaly noted.  ?CV: RRR with no murmurs appreciated ?Pulm: NWOB, CTAB with no crackles, wheezes, or rhonchi ?GI: Normal bowel sounds present. Soft, Nontender, Nondistended. ?MSK: no edema, cyanosis, or clubbing noted tenderness with ROM right ankle point tenderness on right lateral ankle.  ?Skin: warm, dry ?Psych: Normal affect and thought content ? ?BP 116/86   Pulse 98   Ht 5\' 9"  (1.753 m)   Wt 202 lb (91.6 kg)   LMP 07/04/2021   SpO2 98%   BMI 29.83 kg/m?  ?Wt Readings from Last 3 Encounters:  ?07/23/21 202 lb (91.6 kg)  ?06/12/21 204 lb (92.5 kg)  ?05/15/20 211 lb (95.7 kg)  ? ? ? ?Health Maintenance Due  ?Topic Date Due  ? Hepatitis C Screening  Never done  ? PAP SMEAR-Modifier  09/19/2019  ? INFLUENZA VACCINE  Never done  ? ? ?There are no preventive care reminders to display for this patient. ? ?No results found for: TSH ?Lab Results  ?Component Value Date  ? WBC 5.6 07/20/2021  ? HGB 12.2 07/20/2021  ? HCT 37.6 07/20/2021  ? MCV 82.8 07/20/2021  ? PLT 295.0 07/20/2021  ? ?Lab Results  ?Component Value Date  ? NA 137 07/20/2021  ? K 3.6 07/20/2021  ? CO2 28 07/20/2021  ? GLUCOSE 107 (H) 07/20/2021  ? BUN 14 07/20/2021  ? CREATININE 0.98 07/20/2021  ? BILITOT 0.5 07/20/2021  ? ALKPHOS 63 07/20/2021  ? AST 16 07/20/2021  ? ALT 12 07/20/2021  ? PROT 7.1 07/20/2021  ? ALBUMIN 4.2 07/20/2021  ? CALCIUM 9.2 07/20/2021  ? ANIONGAP 8 08/06/2019  ? GFR 72.04 07/20/2021  ? ?Lab Results  ?Component Value Date  ? CHOL 192 07/15/2016  ? ?Lab Results  ?Component Value Date  ? HDL 51 07/15/2016  ? ?No results found for: LDLCALC ?No results found for: TRIG ?No results found for: CHOLHDL ?No results found for: HGBA1C ? ?  ?Assessment & Plan:  ? ?Problem List Items Addressed This Visit   ? ?  ? Musculoskeletal and Integument  ? Rupture of ligament of right ankle - Primary  ?  Pending workup for pre op exam today. Workup in progress.   ?  ?  ?  ? Genitourinary  ? Asymptomatic microscopic hematuria  ?  Has been evaluated by urology in the past, benign findings. Chronic. Reviewed. Dose have hgn in urinalysis. ?  ?  ?  ? Other  ? Right ankle instability  ?  Pending surgery pre op .  ?  ?  ? Pre-op examination  ?  CXR reviewed, no acute findings ?Labs reviewed , no acute unexpected findings ?EKG today in office showing NSR ?Pt advised to d/c ashwaghanda  ? ?Pt is stable and considered  low risk for surgery ?Pt is cleared for surgery from a primary care perspective.  ?  ?  ? Relevant Orders  ? EKG 12-Lead (Completed)  ? ? ?No orders of the defined types were placed in this encounter. ? ? ?Follow-up: No follow-ups on file.  ? ? ?Mort Sawyersabitha Casara Perrier, FNP ?

## 2021-07-23 NOTE — Assessment & Plan Note (Addendum)
CXR reviewed, no acute findings ?Labs reviewed , no acute unexpected findings ?EKG today in office showing NSR ?Pt advised to d/c ashwaghanda  ? ?Pt is stable and considered low risk for surgery ?Pt is cleared for surgery from a primary care perspective.  ?

## 2021-07-23 NOTE — Telephone Encounter (Signed)
Surgical clearance form complete and faxed to [phone] ?

## 2021-07-26 ENCOUNTER — Other Ambulatory Visit: Payer: Self-pay

## 2021-07-26 ENCOUNTER — Ambulatory Visit: Payer: 59 | Admitting: Anesthesiology

## 2021-07-26 ENCOUNTER — Ambulatory Visit: Payer: Self-pay

## 2021-07-26 ENCOUNTER — Ambulatory Visit
Admission: RE | Admit: 2021-07-26 | Discharge: 2021-07-26 | Disposition: A | Discharge status: Home / Self Care | Payer: 59 | Attending: Podiatry | Admitting: Podiatry

## 2021-07-26 ENCOUNTER — Encounter
Admission: RE | Disposition: A | Discharge status: Home / Self Care | Payer: Self-pay | Source: Home / Self Care | Attending: Podiatry

## 2021-07-26 ENCOUNTER — Encounter: Payer: Self-pay | Admitting: Podiatry

## 2021-07-26 DIAGNOSIS — M25371 Other instability, right ankle: Secondary | ICD-10-CM | POA: Insufficient documentation

## 2021-07-26 DIAGNOSIS — M659 Synovitis and tenosynovitis, unspecified: Secondary | ICD-10-CM | POA: Insufficient documentation

## 2021-07-26 DIAGNOSIS — S93401A Sprain of unspecified ligament of right ankle, initial encounter: Secondary | ICD-10-CM | POA: Insufficient documentation

## 2021-07-26 DIAGNOSIS — X58XXXA Exposure to other specified factors, initial encounter: Secondary | ICD-10-CM | POA: Insufficient documentation

## 2021-07-26 DIAGNOSIS — S93491A Sprain of other ligament of right ankle, initial encounter: Secondary | ICD-10-CM | POA: Insufficient documentation

## 2021-07-26 DIAGNOSIS — G629 Polyneuropathy, unspecified: Secondary | ICD-10-CM | POA: Insufficient documentation

## 2021-07-26 DIAGNOSIS — M25471 Effusion, right ankle: Secondary | ICD-10-CM | POA: Diagnosis not present

## 2021-07-26 HISTORY — PX: ANKLE RECONSTRUCTION: SHX1151

## 2021-07-26 HISTORY — PX: WOUND DEBRIDEMENT: SHX247

## 2021-07-26 HISTORY — DX: Partial loss of teeth, unspecified cause, unspecified class: K08.409

## 2021-07-26 LAB — POCT PREGNANCY, URINE: Preg Test, Ur: NEGATIVE

## 2021-07-26 SURGERY — RECONSTRUCTION, ANKLE
Anesthesia: General | Site: Foot | Laterality: Right

## 2021-07-26 MED ORDER — FENTANYL CITRATE (PF) 100 MCG/2ML IJ SOLN
INTRAMUSCULAR | Status: DC | PRN
  Administered 2021-07-26: 50 ug via INTRAVENOUS
  Administered 2021-07-26: 100 ug via INTRAVENOUS

## 2021-07-26 MED ORDER — OXYCODONE HCL 5 MG PO TABS: 5.0000 mg | ORAL_TABLET | Freq: Once | ORAL | Status: DC | PRN

## 2021-07-26 MED ORDER — ACETAMINOPHEN 160 MG/5ML PO SOLN: 325.0000 mg | ORAL | Status: DC | PRN

## 2021-07-26 MED ORDER — ONDANSETRON HCL 4 MG/2ML IJ SOLN: 4.0000 mg | Freq: Once | INTRAMUSCULAR | Status: DC | PRN

## 2021-07-26 MED ORDER — ONDANSETRON HCL 4 MG PO TABS: 4.0000 mg | ORAL_TABLET | Freq: Three times a day (TID) | ORAL | 0 refills | Status: DC | PRN

## 2021-07-26 MED ORDER — BUPIVACAINE LIPOSOME 1.3 % IJ SUSP
INTRAMUSCULAR | Status: DC | PRN
  Administered 2021-07-26: 20 mL via PERINEURAL

## 2021-07-26 MED ORDER — DOXYCYCLINE HYCLATE 100 MG PO CAPS
100.0000 mg | ORAL_CAPSULE | Freq: Two times a day (BID) | ORAL | 0 refills | Status: DC
Start: 2021-07-26 — End: 2022-10-18

## 2021-07-26 MED ORDER — FENTANYL CITRATE PF 50 MCG/ML IJ SOSY: 25.0000 ug | PREFILLED_SYRINGE | INTRAMUSCULAR | Status: DC | PRN

## 2021-07-26 MED ORDER — PROPOFOL 10 MG/ML IV BOLUS
INTRAVENOUS | Status: DC | PRN
Start: 2021-07-26 — End: 2021-07-26
  Administered 2021-07-26: 150 mg via INTRAVENOUS
  Administered 2021-07-26: 50 mg via INTRAVENOUS

## 2021-07-26 MED ORDER — ONDANSETRON HCL 4 MG/2ML IJ SOLN
INTRAMUSCULAR | Status: DC | PRN
  Administered 2021-07-26: 4 mg via INTRAVENOUS

## 2021-07-26 MED ORDER — ASPIRIN EC 81 MG PO TBEC: 81.0000 mg | DELAYED_RELEASE_TABLET | Freq: Two times a day (BID) | ORAL | 0 refills | Status: AC

## 2021-07-26 MED ORDER — LIDOCAINE HCL (CARDIAC) PF 100 MG/5ML IV SOSY
PREFILLED_SYRINGE | INTRAVENOUS | Status: DC | PRN
  Administered 2021-07-26: 60 mg via INTRATRACHEAL

## 2021-07-26 MED ORDER — OXYCODONE HCL 5 MG/5ML PO SOLN: 5.0000 mg | Freq: Once | ORAL | Status: DC | PRN

## 2021-07-26 MED ORDER — BUPIVACAINE HCL (PF) 0.5 % IJ SOLN
INTRAMUSCULAR | Status: DC | PRN
  Administered 2021-07-26: 20 mL via PERINEURAL

## 2021-07-26 MED ORDER — MIDAZOLAM HCL 5 MG/5ML IJ SOLN
INTRAMUSCULAR | Status: DC | PRN
Start: 2021-07-26 — End: 2021-07-26
  Administered 2021-07-26: 2 mg via INTRAVENOUS

## 2021-07-26 MED ORDER — DEXAMETHASONE SODIUM PHOSPHATE 4 MG/ML IJ SOLN
INTRAMUSCULAR | Status: DC | PRN
Start: 2021-07-26 — End: 2021-07-26
  Administered 2021-07-26: 4 mg via INTRAVENOUS

## 2021-07-26 MED ORDER — LACTATED RINGERS IV SOLN: INTRAVENOUS | Status: DC

## 2021-07-26 MED ORDER — LACTATED RINGERS IR SOLN
Status: DC | PRN
  Administered 2021-07-26: 3000 mL

## 2021-07-26 MED ORDER — SCOPOLAMINE 1 MG/3DAYS TD PT72
1.0000 | MEDICATED_PATCH | TRANSDERMAL | Status: DC
  Administered 2021-07-26: 1.5 mg via TRANSDERMAL

## 2021-07-26 MED ORDER — ACETAMINOPHEN 325 MG PO TABS: 325.0000 mg | ORAL_TABLET | ORAL | Status: DC | PRN

## 2021-07-26 MED ORDER — OXYCODONE-ACETAMINOPHEN 5-325 MG PO TABS: 1.0000 | ORAL_TABLET | Freq: Four times a day (QID) | ORAL | 0 refills | Status: AC | PRN

## 2021-07-26 MED ORDER — CLINDAMYCIN PHOSPHATE 900 MG/50ML IV SOLN
900.0000 mg | INTRAVENOUS | Status: AC
  Administered 2021-07-26: 900 mg via INTRAVENOUS

## 2021-07-26 MED ORDER — GLYCOPYRROLATE 0.2 MG/ML IJ SOLN
INTRAMUSCULAR | Status: DC | PRN
  Administered 2021-07-26: .1 mg via INTRAVENOUS

## 2021-07-26 SURGICAL SUPPLY — 49 items
ANCH SUT NDL DX FBRTK STRL LF (Anchor) ×4 IMPLANT
ANCHOR SUT FBRTK 1.3 SUTTAP (Anchor) ×2 IMPLANT
BNDG CMPR STD VLCR NS LF 5.8X4 (GAUZE/BANDAGES/DRESSINGS) ×4
BNDG CMPR STD VLCR NS LF 5.8X6 (GAUZE/BANDAGES/DRESSINGS) ×2
BNDG COHESIVE 4X5 TAN ST LF (GAUZE/BANDAGES/DRESSINGS) ×3 IMPLANT
BNDG ELASTIC 4X5.8 VLCR NS LF (GAUZE/BANDAGES/DRESSINGS) ×6 IMPLANT
BNDG ELASTIC 6X5.8 VLCR NS LF (GAUZE/BANDAGES/DRESSINGS) ×3 IMPLANT
BNDG ESMARK 4X12 TAN STRL LF (GAUZE/BANDAGES/DRESSINGS) ×3 IMPLANT
BNDG GAUZE ELAST 4 BULKY (GAUZE/BANDAGES/DRESSINGS) ×3 IMPLANT
COVER LIGHT HANDLE FLEXIBLE (MISCELLANEOUS) ×6 IMPLANT
CUFF TOURN SGL QUICK 30 (TOURNIQUET CUFF) ×3
CUFF TRNQT CYL 30X4X21-28X (TOURNIQUET CUFF) IMPLANT
DRAPE FLUOR MINI C-ARM 54X84 (DRAPES) ×1 IMPLANT
DRAPE STERI 35X30 U-POUCH (DRAPES) ×3 IMPLANT
DURAPREP 26ML APPLICATOR (WOUND CARE) ×3 IMPLANT
GAUZE SPONGE 4X4 12PLY STRL (GAUZE/BANDAGES/DRESSINGS) ×3 IMPLANT
GAUZE XEROFORM 1X8 LF (GAUZE/BANDAGES/DRESSINGS) ×3 IMPLANT
GLOVE SURG ENC MOIS LTX SZ7 (GLOVE) ×3 IMPLANT
GLOVE SURG POLYISO LF SZ7 (GLOVE) ×6 IMPLANT
GLOVE SURG UNDER POLY LF SZ7 (GLOVE) ×3 IMPLANT
GOWN STRL REUS W/ TWL LRG LVL3 (GOWN DISPOSABLE) ×4 IMPLANT
GOWN STRL REUS W/TWL LRG LVL3 (GOWN DISPOSABLE) ×6
IMPL INTERNAL BRACE BIO (Anchor) IMPLANT
IMPLANT INTERNAL BRACE BIO (Anchor) ×3 IMPLANT
IV LACTATED RINGER IRRG 3000ML (IV SOLUTION) ×3
IV LR IRRIG 3000ML ARTHROMATIC (IV SOLUTION) ×8 IMPLANT
IV NS 250ML (IV SOLUTION) ×3
IV NS 250ML BAXH (IV SOLUTION) ×2 IMPLANT
KIT FIBERTAK DX 1.6 DISP (KITS) ×1 IMPLANT
KIT TURNOVER KIT A (KITS) ×3 IMPLANT
MANIFOLD 4PT FOR NEPTUNE1 (MISCELLANEOUS) ×3 IMPLANT
NDL SPNL 18GX3.5 QUINCKE PK (NEEDLE) ×2 IMPLANT
NEEDLE SPNL 18GX3.5 QUINCKE PK (NEEDLE) ×3 IMPLANT
PACK EXTREMITY ARMC (MISCELLANEOUS) ×3 IMPLANT
PADDING CAST BLEND 4X4 NS (MISCELLANEOUS) ×12 IMPLANT
PENCIL SMOKE EVACUATOR (MISCELLANEOUS) ×3 IMPLANT
RESECTOR FULL RADIUS 2.9 (ORTHOPEDIC DISPOSABLE SUPPLIES) ×1 IMPLANT
SPLINT CAST 1 STEP 4X30 (MISCELLANEOUS) ×3 IMPLANT
STOCKINETTE IMPERVIOUS LG (DRAPES) ×1 IMPLANT
STOCKINETTE TUB 6IN (MISCELLANEOUS) ×3 IMPLANT
SUT ETHILON 3-0 FS-10 30 BLK (SUTURE) ×3
SUT VIC AB 2-0 SH 27 (SUTURE) ×3
SUT VIC AB 2-0 SH 27XBRD (SUTURE) IMPLANT
SUT VIC AB 3-0 SH 27 (SUTURE) ×3
SUT VIC AB 3-0 SH 27X BRD (SUTURE) IMPLANT
SUTURE EHLN 3-0 FS-10 30 BLK (SUTURE) IMPLANT
TUBING INFLOW SET DBFLO PUMP (TUBING) ×3 IMPLANT
TUBING OUTFLOW SET DBLFO PUMP (TUBING) ×3 IMPLANT
WAND TOPAZ MICRO DEBRIDER (MISCELLANEOUS) ×3 IMPLANT

## 2021-07-26 NOTE — Transfer of Care (Signed)
Immediate Anesthesia Transfer of Care Note ? ?Patient: Paula Page ? ?Procedure(s) Performed: RECONSTRUCTION ANKLE (Right: Ankle) ?A-SCOPE/DEBRIDEMENT; EXTENSIVE (Right: Foot) ? ?Patient Location: PACU ? ?Anesthesia Type: General LMA ? ?Level of Consciousness: awake, alert  and patient cooperative ? ?Airway and Oxygen Therapy: Patient Spontanous Breathing and Patient connected to supplemental oxygen ? ?Post-op Assessment: Post-op Vital signs reviewed, Patient's Cardiovascular Status Stable, Respiratory Function Stable, Patent Airway and No signs of Nausea or vomiting ? ?Post-op Vital Signs: Reviewed and stable ? ?Complications: No notable events documented. ? ?

## 2021-07-26 NOTE — Anesthesia Preprocedure Evaluation (Signed)
Anesthesia Evaluation  ?Patient identified by MRN, date of birth, ID band ?Patient awake ? ? ? ?Reviewed: ?Allergy & Precautions, H&P , NPO status , Patient's Chart, lab work & pertinent test results ? ?Airway ?Mallampati: II ? ?TM Distance: >3 FB ?Neck ROM: full ? ? ? Dental ?no notable dental hx. ? ?  ?Pulmonary ? ?  ?Pulmonary exam normal ?breath sounds clear to auscultation ? ? ? ? ? ? Cardiovascular ?Normal cardiovascular exam ?Rhythm:regular Rate:Normal ? ? ?  ?Neuro/Psych ?  ? GI/Hepatic ?GERD  ,  ?Endo/Other  ? ? Renal/GU ?  ? ?  ?Musculoskeletal ? ? Abdominal ?  ?Peds ? Hematology ?  ?Anesthesia Other Findings ? ? Reproductive/Obstetrics ? ?  ? ? ? ? ? ? ? ? ? ? ? ? ? ?  ?  ? ? ? ? ? ? ? ? ?Anesthesia Physical ?Anesthesia Plan ? ?ASA: 2 ? ?Anesthesia Plan: General LMA  ? ?Post-op Pain Management: Regional block  ? ?Induction:  ? ?PONV Risk Score and Plan: 3 and Treatment may vary due to age or medical condition, Ondansetron, Dexamethasone, Midazolam and Scopolamine patch - Pre-op ? ?Airway Management Planned:  ? ?Additional Equipment:  ? ?Intra-op Plan:  ? ?Post-operative Plan:  ? ?Informed Consent: I have reviewed the patients History and Physical, chart, labs and discussed the procedure including the risks, benefits and alternatives for the proposed anesthesia with the patient or authorized representative who has indicated his/her understanding and acceptance.  ? ? ? ?Dental Advisory Given ? ?Plan Discussed with: CRNA ? ?Anesthesia Plan Comments:   ? ? ? ? ? ? ?Anesthesia Quick Evaluation ? ?

## 2021-07-26 NOTE — Anesthesia Postprocedure Evaluation (Signed)
Anesthesia Post Note ? ?Patient: Paula Page ? ?Procedure(s) Performed: RECONSTRUCTION ANKLE (Right: Ankle) ?A-SCOPE/DEBRIDEMENT; EXTENSIVE (Right: Foot) ? ? ?  ?Patient location during evaluation: PACU ?Anesthesia Type: General ?Level of consciousness: awake and alert and oriented ?Pain management: satisfactory to patient ?Vital Signs Assessment: post-procedure vital signs reviewed and stable ?Respiratory status: spontaneous breathing, nonlabored ventilation and respiratory function stable ?Cardiovascular status: blood pressure returned to baseline and stable ?Postop Assessment: Adequate PO intake and No signs of nausea or vomiting ?Anesthetic complications: no ? ? ?No notable events documented. ? ?[place] ? ? ? ? ? ?

## 2021-07-26 NOTE — Anesthesia Procedure Notes (Signed)
Procedure Name: LMA Insertion ?Date/Time: 07/26/2021 10:14 AM ?Performed by: Michaele Offer, CRNA ?Pre-anesthesia Checklist: Patient identified, Emergency Drugs available, Suction available, Patient being monitored and Timeout performed ?Patient Re-evaluated:Patient Re-evaluated prior to induction ?Oxygen Delivery Method: Circle system utilized ?Preoxygenation: Pre-oxygenation with 100% oxygen ?Induction Type: IV induction ?Ventilation: Mask ventilation without difficulty ?LMA: LMA inserted ?LMA Size: 4.0 ?Number of attempts: 1 ?Placement Confirmation: positive ETCO2 and breath sounds checked- equal and bilateral ?Tube secured with: Tape ?Dental Injury: Teeth and Oropharynx as per pre-operative assessment  ? ? ? ? ?

## 2021-07-26 NOTE — Op Note (Signed)
PODIATRY / FOOT AND ANKLE SURGERY OPERATIVE REPORT ? ? ? ?SURGEON: Caroline More, DPM ? ?PRE-OPERATIVE DIAGNOSIS:  ?1.  Right ankle synovitis ?2.  Right ATFL ligament rupture with lateral ankle instability ? ?POST-OPERATIVE DIAGNOSIS: Same ? ?PROCEDURE(S): ?Right ankle arthroscopy with extensive debridement of synovium and tissue ?Right lateral ankle ligament reconstruction of ATFL with internal brace ? ?HEMOSTASIS: Right thigh tourniquet ? ?ANESTHESIA: general ? ?ESTIMATED BLOOD LOSS: 20 cc ? ?FINDING(S): ?1.  Complete rupture of the right anterior talofibular ligament ?2.  Right ankle synovitis ? ?PATHOLOGY/SPECIMEN(S): None ? ?INDICATIONS:   ?Paula Page is a 41 y.o. female who presents with instability to the right ankle.  Patient feels though that she is constantly spraining her ankle or having some instability in the ankle.  She notices cracking with motion that causes pain discomfort.  Patient has tried wearing different shoes as well as inserts and has been in a boot and has done bracing as well as some physical therapy activities but continues to have pain discomfort to the lateral ankle area.  MRI imaging was obtained after x-ray imaging did not show any pathology which showed a complete rupture of the anterior talofibular ligament with ankle joint effusion.  All treatment options were discussed with the patient both conservative and surgical attempts at correction including potential risks and complications at this time patient is elected for surgery described above.  No guarantees given.  Consent obtained prior to procedure.. ? ?DESCRIPTION: ?After obtaining full informed written consent, the patient was brought back to the operating room and placed supine upon the operating table.  The patient received IV antibiotics prior to induction.  After obtaining adequate anesthesia, the patient was prepped and draped in the standard fashion.  An Esmarch bandage was used to exsanguinate the right lower  extremity and pneumatic thigh tourniquet was inflated. ? ?Attention was directed to the anterior aspect of the right ankle where an incision was marked medial to the tendon of the tibialis anterior at the level of the ankle joint at the medial gutter.  A 22-gauge needle was introduced into the ankle joint and the ankle was insufflated with 10 cc of lactated ringer solution.  A small percutaneous incision was made at this same area and blunt dissection was continued down with a hemostat and the ankle joint was entered and a rush of fluid was noted from the ankle capsule area after the injection was given into the joint indicating successful entry into the joint.  The obturator and cannula was then introduced into the medial portal.  Once in the portal the obturator was removed and the scope was placed visualizing the ankle joint and the ankle was insufflated.  The ankle joint was inspected and noted to have no cartilage damage present.  There appeared to be some mild inflammation of the synovium at the lateral aspect of the ankle joint with frayed tissue at the ATFL level indicative of ankle instability and ligament attenuation.  Transillumination was then performed to the anterior lateral aspect of the ankle and a small percutaneous stab incision was made and a hemostat was used to dissect down to the ankle joint level.  The shaver was then placed through the anterior lateral portal.  The anterior, anterior lateral and lateral aspect of the ankle joint were then debrided removing inflamed synovium and inflamed fluid.  Pictures were taken along the way showing no cartilage damage of the ankle joint at the lateral malleolus level, once again some of the ATFL  remnant tissue was debrided with the shaver.  The instrumentation was then removed and switched in the medial aspect of the ankle joint was then debrided with a micro shaver removing any inflamed synovium present.  The cartilage again appeared to be intact at the  anterior medial and medial aspects of the ankle joint with with no damage.  The fluid was then sucked out of the ankle joint and the instrumentation was removed.  The 2 incisions were reapproximated well coapted with 3-0 Vicryl in a combination of simple and horizontal mattress type stitching. ? ?The ankle joint was then stressed under fluoroscopic guidance with an inversion type stress.  Notable dislocation was present with talar tilt positive when performing this indicative of a lateral ankle ligament disruption.  Anterior drawer sign also positive with fluoroscopic guidance. ? ?Attention was then directed to the lateral aspect of the ankle joint where a longitudinal incision was made over the distal tip of the lateral malleolus and extended over the ATFL tissue to the lateral talar area.  The incision was deepened to the subcutaneous tissues utilizing sharp and blunt dissection and care was taken to identify retract all vital neurovascular structures and all venous contributories were cauterized as necessary.  At this time the extensor retinaculum was identified and dissected free and retracted.  A full-thickness incision was then made through the lateral ankle joint capsule and remnant ATFL tissue.  There appeared to be very little ligament intact in this area and appeared to be very loose and hypermobile.  Full-thickness cuff of tissue was then raised off of the distal fibula.  A rush of fluid was obtained when entering into the ankle joint indicating successful entry.  There appeared to be no cartilage damage once again to the distal fibula at the lateral malleolus or at the lateral talus.  At this time a small incision was made through the capsular tissue at the lateral aspect of the talus and a guidewire was placed into this area.  The Arthrex internal brace system was then used and as a drill was then applied over the wire.  Fluoroscopic guidance was used to obtain visualization of the area and placement of  the wire and the drill which appeared to be excellent.  The Arthrex internal brace screw was then placed into the lateral talus at the talar neck area and excellent seating was noted.  Attention was then directed back to the distal fibula where 2 fiber tack anchors were placed into the distal fibula utilizing standard manufactures protocol.  Once this was performed the foot was held in a dorsiflexed and everted position and the fiber tack sutures were then brought through the remnant ATFL tissue as well as lateral ankle capsule and while holding the foot in a dorsiflexed and everted position the sutures were tied to the distal fibula.  Excellent seating was noted.  Reduction and ankle joint inversion was noted with stressing.  After this was performed a drill hole was then placed into the distal fibula for placement of the ATFL internal brace system.  This was placed in between the 2 fiber tack anchors and slightly more proximal.  This was done once again under fluoroscopic guidance.  After drilling and tapping the area utilizing standard technique the suture from the talar portion of the internal brace was then placed into the anchor system and under the appropriate tension another anchor was placed into the fibular hole while holding the foot in a dorsiflexed and everted position making sure  to not place that attention too tight to the area.  Excellent seating was noted overall.  Stressing was then performed of the ankle under fluoroscopic guidance and did not appear to dislocate indicating successful repair.  The surgical site was flushed with copious amounts normal sterile saline.  Reinforcement was then performed of the repair suturing the distal fibular cuff of tissue that was raised to the extensor retinaculum and lateral ankle joint capsule with 2-0 Vicryl.  The subcutaneous tissue was then reapproximated well coapted with 4-0 Vicryl and the skin was then reapproximated well coapted with 3-0 nylon in a  combination of simple and horizontal mattress type stitching. ? ?A postoperative dressing was then applied consisting of Xeroform followed by 4 x 4 gauze, Kerlix, Webril, posterior splint, Ace wrap.  The pneumati

## 2021-07-26 NOTE — Anesthesia Procedure Notes (Signed)
Anesthesia Regional Block: Adductor canal block  ? ?Pre-Anesthetic Checklist: , timeout performed,  Correct Patient, Correct Site, Correct Laterality,  Correct Procedure, Correct Position, site marked,  Risks and benefits discussed,  Surgical consent,  Pre-op evaluation,  At surgeon's request and post-op pain management ? ?Laterality: Right ? ?Prep: chloraprep     ?  ?Needles:  ?Injection technique: Single-shot ? ?Needle Type: Echogenic Needle   ? ? ?Needle Length: 9cm  ?Needle Gauge: 21  ? ? ? ?Additional Needles: ? ? ?Procedures:,,,, ultrasound used (permanent image in chart),,    ?Narrative:  ?Start time: 07/26/2021 9:05 AM ?End time: 07/26/2021 9:15 AM ?Injection made incrementally with aspirations every 5 mL. ? ?Performed by: Personally  ?Anesthesiologist: Ronelle Nigh, MD ? ?Additional Notes: ?Functioning IV was confirmed and monitors applied. Ultrasound guidance: relevant anatomy identified, needle position confirmed, local anesthetic spread visualized around nerve(s)., vascular puncture avoided.  Image printed for medical record.  Negative aspiration and no paresthesias; incremental administration of local anesthetic. The patient tolerated the procedure well. Vitals signes recorded in RN notes.  Exparel 91ml,  0.5% Bup 25ml ? ? ? ?

## 2021-07-26 NOTE — Anesthesia Procedure Notes (Signed)
Anesthesia Regional Block: Popliteal block  ? ?Pre-Anesthetic Checklist: , timeout performed,  Correct Patient, Correct Site, Correct Laterality,  Correct Procedure, Correct Position, site marked,  Risks and benefits discussed,  Surgical consent,  Pre-op evaluation,  At surgeon's request and post-op pain management ? ?Laterality: Right ? ?Prep: chloraprep     ?  ?Needles:  ?Injection technique: Single-shot ? ?Needle Type: Echogenic Needle   ? ? ?Needle Length: 9cm  ?Needle Gauge: 21  ? ? ? ?Additional Needles: ? ? ?Procedures:,,,, ultrasound used (permanent image in chart),,    ?Narrative:  ?Start time: 07/26/2021 9:05 AM ?End time: 07/26/2021 9:15 AM ?Injection made incrementally with aspirations every 5 mL. ? ?Performed by: Personally  ?Anesthesiologist: Ranee Gosselin, MD ? ?Additional Notes: ?Functioning IV was confirmed and monitors applied. Ultrasound guidance: relevant anatomy identified, needle position confirmed, local anesthetic spread visualized around nerve(s)., vascular puncture avoided.  Image printed for medical record.  Negative aspiration and no paresthesias; incremental administration of local anesthetic. The patient tolerated the procedure well. Vitals signes recorded in RN notes.  Exparel 56ml, 0.5% Bup 50ml. ? ? ? ?

## 2021-07-26 NOTE — H&P (Signed)
HISTORY AND PHYSICAL INTERVAL NOTE: ? ?07/26/2021 ? ?8:13 AM ? ?HAMNA ASA  has presented today for surgery, with the diagnosis of S93.401D - Rupture of ligament of ankle, right, subsequent encounter ?M25.371 - Right ankle instability ?A63.016W - Sprain of anterior talofibular ligament of right ankle, subsequent encounter ?G62.9 - Polyneuropathy, unspecified ?M25.471 - Right ankle effusion.  The various methods of treatment have been discussed with the patient.  No guarantees were given.  After consideration of risks, benefits and other options for treatment, the patient has consented to surgery.  I have reviewed the patients? chart and labs.   ? ?PROCEDURE: ?RIGHT ANKLE ARTHROSCOPY WITH EXTENSIVE DEBRIDEMENT ?RIGHT LATERAL ANKLE LIGAMENT REPAIR OF ATFL WITH INTERNAL BRACE AUGMENTATION ? ?A history and physical examination was performed in my office.  The patient was reexamined.  There have been no changes to this history and physical examination. ? ?Rosetta Posner, DPM ? ?

## 2021-07-27 ENCOUNTER — Encounter: Payer: Self-pay | Admitting: Podiatry

## 2021-08-08 ENCOUNTER — Other Ambulatory Visit: Payer: Self-pay

## 2021-08-08 ENCOUNTER — Encounter: Payer: Self-pay | Admitting: Emergency Medicine

## 2021-08-08 ENCOUNTER — Emergency Department: Payer: 59

## 2021-08-08 DIAGNOSIS — M25571 Pain in right ankle and joints of right foot: Secondary | ICD-10-CM | POA: Diagnosis not present

## 2021-08-08 NOTE — ED Triage Notes (Addendum)
Pt to ED from home c/o right ankle pain.  States ankle surgery 3/16, stitches removed today and then pain started tonight while eating dinner.  Denies injury at home.  Last took prescribed oxycodone around 2145 tonight.  Incisions closed, non bleeding, post op boot and ace wrap in place, foot warm and pulses palpated. ?

## 2021-08-09 ENCOUNTER — Emergency Department
Admission: EM | Admit: 2021-08-09 | Discharge: 2021-08-09 | Disposition: A | Discharge status: Home / Self Care | Payer: 59 | Attending: Emergency Medicine | Admitting: Emergency Medicine

## 2021-08-09 DIAGNOSIS — M25571 Pain in right ankle and joints of right foot: Secondary | ICD-10-CM

## 2021-08-09 NOTE — ED Notes (Signed)
Pt discharge information reviewed. Pt understands need for follow up care and when to return if symptoms worsen. All questions answered. Pt is alert and oriented with even and regular respirations. Pt refuses discharge vitals. Pt is brought out of department in wheelchair with friend.  ?

## 2021-08-09 NOTE — ED Provider Notes (Signed)
? ?[facility] ?Provider Note ? ? ? Event Date/Time  ? First MD Initiated Contact with Patient 08/09/21 0135   ?  (approximate) ? ? ?History  ? ?Ankle Pain ? ? ?HPI ? ?Paula Page is a 41 y.o. female who is recently postop with Dr. Luana Shu for surgery to repair a partial ligament tear on her right ankle.  She presents for evaluation of worsening ankle pain.  She has been recovering from the surgery and had a follow-up appointment earlier today with Dr. Luana Shu.  He removed some sutures and put her in a walking boot.  However she was told to remain nonweightbearing and to use the boot at night to keep the foot and ankle from moving around. ? ?She reports that the pain started this evening and was severe.  She says she feels like the reason for structure of the boot is rubbing on the wound.  She has not had any bleeding or swelling.  She tried to pad the boot for some additional protection but that seemed to make it tighter. ?  ? ? ?Physical Exam  ? ?Triage Vital Signs: ?ED Triage Vitals [08/08/21 2224]  ?Enc Vitals Group  ?   BP 133/88  ?   Pulse Rate (!) 115  ?   Resp 18  ?   Temp 99.1 ?F (37.3 ?C)  ?   Temp Source Oral  ?   SpO2 100 %  ?   Weight 90.7 kg (200 lb)  ?   Height 1.753 m (5\' 9" )  ?   Head Circumference   ?   Peak Flow   ?   Pain Score 10  ?   Pain Loc   ?   Pain Edu?   ?   Excl. in [place]?   ? ? ?Most recent vital signs: ?Vitals:  ? 08/08/21 2224 08/09/21 0133  ?BP: 133/88 120/81  ?Pulse: (!) 115 98  ?Resp: 18 15  ?Temp: 99.1 ?F (37.3 ?C)   ?SpO2: 100% 100%  ? ? ? ?General: Awake, no distress.  ?CV:  Good peripheral perfusion.  ?Resp:  Normal effort.  ?Abd:  No distention.  ?Other:  Well-appearing post operative foot and ankle.  No swelling, no significant erythema that would suggest cellulitis.  No significant tenderness to palpation.  No wound dehiscence.  No purulent discharge. ? ? ?ED Results / Procedures / Treatments  ? ?Labs ?(all labs ordered are listed, but only abnormal results  are displayed) ?Labs Reviewed - No data to display ? ? ? ? ?RADIOLOGY ?I personally reviewed the patient's x-rays of the right ankle and I see no fracture or dislocation.  Radiology does not identify any specific issue. ? ? ? ?PROCEDURES: ? ?Critical Care performed: No ? ?Procedures ? ? ?MEDICATIONS ORDERED IN ED: ?Medications - No data to display ? ? ?IMPRESSION / MDM / ASSESSMENT AND PLAN / ED COURSE  ?I reviewed the triage vital signs and the nursing notes. ?             ?               ? ?Differential diagnosis includes, but is not limited to, postoperative pain, acute post operative infection, compartment syndrome or swelling in general, fracture or dislocation, wound dehiscence. ? ?Patient is well-appearing and in no distress with normal and stable vital signs.  Her wound is well-appearing with no signs of infection.  Compartments are soft and easily compressible.  No sign of acute  or emergent complication. ? ?I reviewed the patient's x-rays and I did not identify any acute abnormalities.  The radiology report agrees. ? ?No indication of an acute or emergent abnormality.  The patient has narcotic pain medicine at home but has been taking less than prescribed.  We talked about take the medication as prescribed, continue to use the brace if possible, providing some additional padding, etc.  She will call the podiatrist in the morning.  She declined any oral analgesia tonight in the ED such as additional Percocet.  No indication for admission or further imaging. ? ? ? ? ?  ? ? ?FINAL CLINICAL IMPRESSION(S) / ED DIAGNOSES  ? ?Final diagnoses:  ?Acute right ankle pain  ? ? ? ?Rx / DC Orders  ? ?ED Discharge Orders   ? ? None  ? ?  ? ? ? ?Note:  This document was prepared using Dragon voice recognition software and may include unintentional dictation errors. ?  ?Hinda Kehr, MD ?08/09/21 [phone] ? ?

## 2021-08-09 NOTE — ED Notes (Signed)
ED Provider at bedside. 

## 2021-08-09 NOTE — Discharge Instructions (Signed)
Your foot and ankle are well-appearing.  Please continue following the recommendations from Dr. Excell Seltzer is much as possible.  You may need to provide additional padding for your boot.  Use the previously prescribed pain medication according to label instructions.  We recommend that you call the office of Dr. Excell Seltzer today and ask for additional advice and possibly a follow-up appointment. ?

## 2021-08-16 ENCOUNTER — Telehealth: Payer: Self-pay

## 2021-08-16 NOTE — Telephone Encounter (Signed)
Spoke to pt. She is still non-weight bearing. Will state PT 08-30-21.  ?

## 2022-01-31 ENCOUNTER — Other Ambulatory Visit: Payer: Self-pay | Admitting: Podiatry

## 2022-01-31 DIAGNOSIS — S93401D Sprain of unspecified ligament of right ankle, subsequent encounter: Secondary | ICD-10-CM

## 2022-02-05 ENCOUNTER — Ambulatory Visit
Admission: RE | Admit: 2022-02-05 | Discharge: 2022-02-05 | Disposition: A | Discharge status: Home / Self Care | Payer: 59 | Source: Ambulatory Visit | Attending: Podiatry | Admitting: Podiatry

## 2022-02-05 DIAGNOSIS — S93401D Sprain of unspecified ligament of right ankle, subsequent encounter: Secondary | ICD-10-CM

## 2022-02-09 ENCOUNTER — Other Ambulatory Visit
Admission: RE | Admit: 2022-02-09 | Discharge: 2022-02-09 | Disposition: A | Discharge status: Home / Self Care | Payer: 59 | Source: Ambulatory Visit | Attending: Family Medicine | Admitting: Family Medicine

## 2022-02-09 DIAGNOSIS — R350 Frequency of micturition: Secondary | ICD-10-CM | POA: Insufficient documentation

## 2022-02-09 DIAGNOSIS — R631 Polydipsia: Secondary | ICD-10-CM | POA: Insufficient documentation

## 2022-02-09 DIAGNOSIS — R519 Headache, unspecified: Secondary | ICD-10-CM | POA: Diagnosis present

## 2022-02-09 DIAGNOSIS — R5383 Other fatigue: Secondary | ICD-10-CM | POA: Diagnosis present

## 2022-02-09 DIAGNOSIS — R8290 Unspecified abnormal findings in urine: Secondary | ICD-10-CM | POA: Diagnosis present

## 2022-02-09 LAB — BASIC METABOLIC PANEL
Anion gap: 5 (ref 5–15)
BUN: 13 mg/dL (ref 6–20)
CO2: 26 mmol/L (ref 22–32)
Calcium: 8.8 mg/dL — ABNORMAL LOW (ref 8.9–10.3)
Chloride: 108 mmol/L (ref 98–111)
Creatinine, Ser: 0.94 mg/dL (ref 0.44–1.00)
GFR, Estimated: >60 mL/min (ref >60)
Glucose, Bld: 101 mg/dL — ABNORMAL HIGH (ref 70–99)
Potassium: 4 mmol/L (ref 3.5–5.1)
Sodium: 139 mmol/L (ref 135–145)

## 2022-08-11 IMAGING — DX DG CHEST 2V
2 series · 2 of 2 positions shown · non-contrast
Comparison: 08/06/2019

CLINICAL DATA: Preop ankle surgery

EXAM:
CHEST - 2 VIEW

[chest pa]
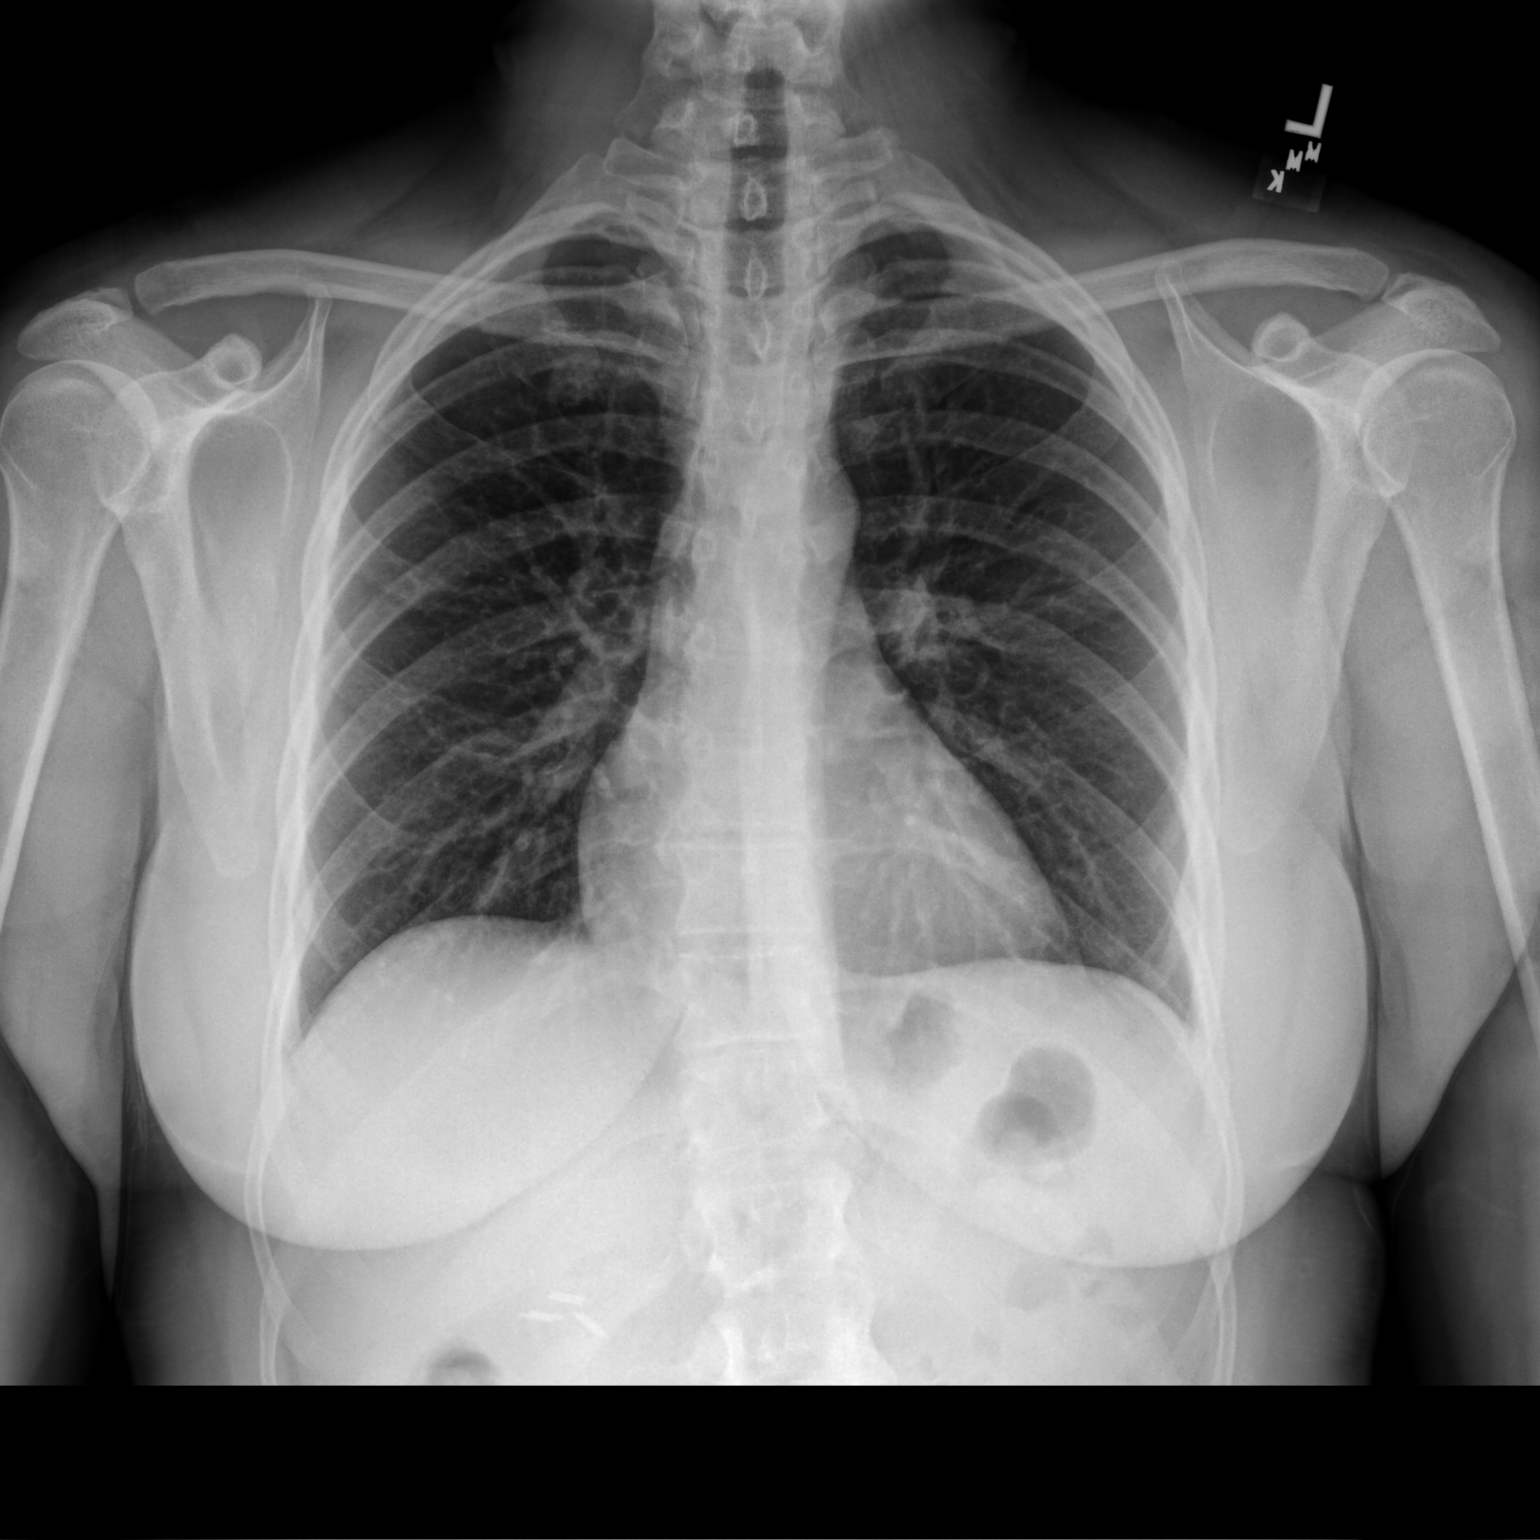

[chest lat]
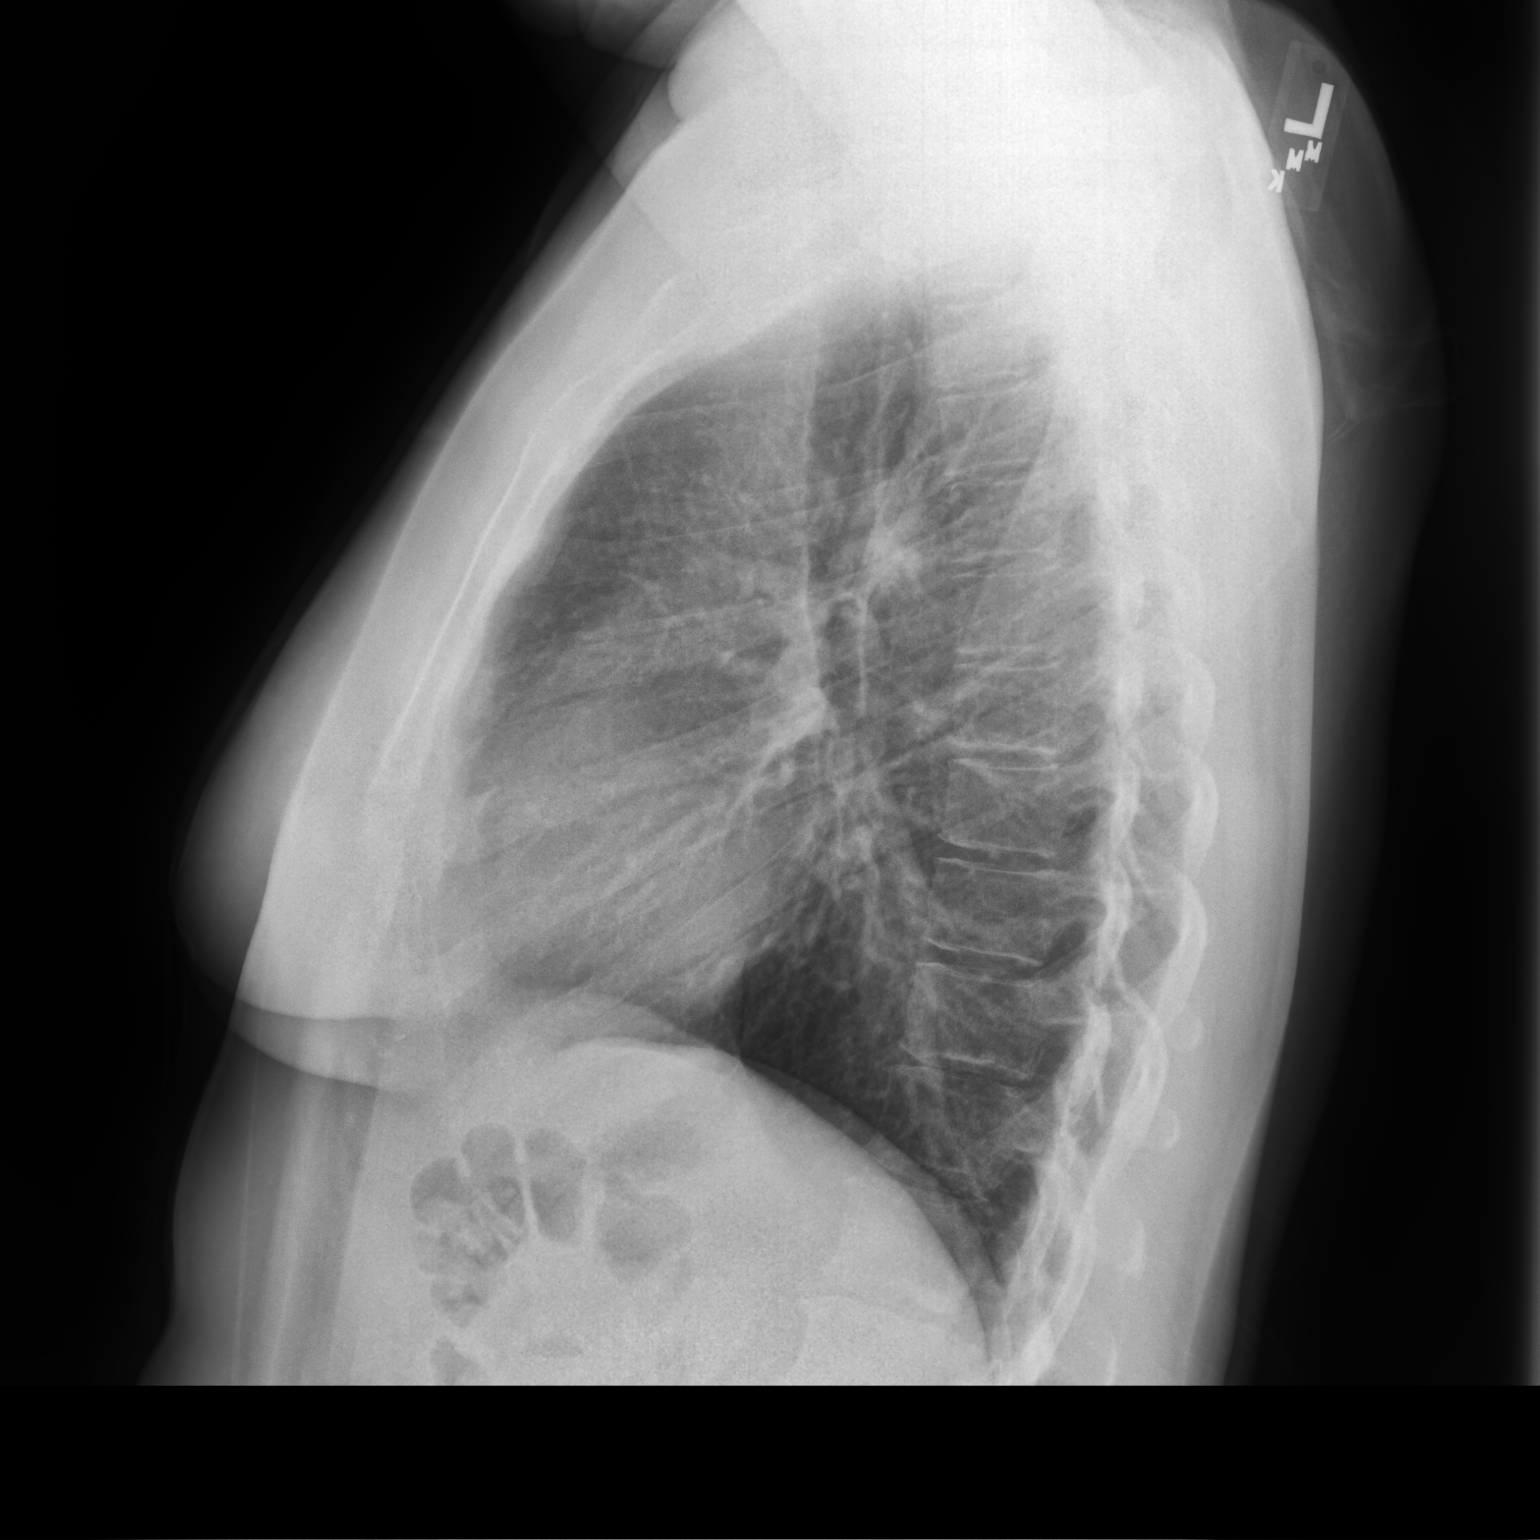

[2 of 2 positions shown; findings below may reference images not displayed]

FINDINGS: The heart size and mediastinal contours are within normal limits.
Both lungs are clear. Mild scoliosis.
IMPRESSION: No active cardiopulmonary disease.

## 2022-08-30 IMAGING — CR DG ANKLE COMPLETE 3+V*R*
1 series · 3 of 3 positions shown · non-contrast
Comparison: 07/03/2021

CLINICAL DATA: Recent right ankle surgery, pain after stitched
removal today

EXAM:
RIGHT ANKLE - COMPLETE 3+ VIEW

[Series 1: dg ankle complete right · 0.14mm/px · 3 of 3 slices shown]
[im 1/3]
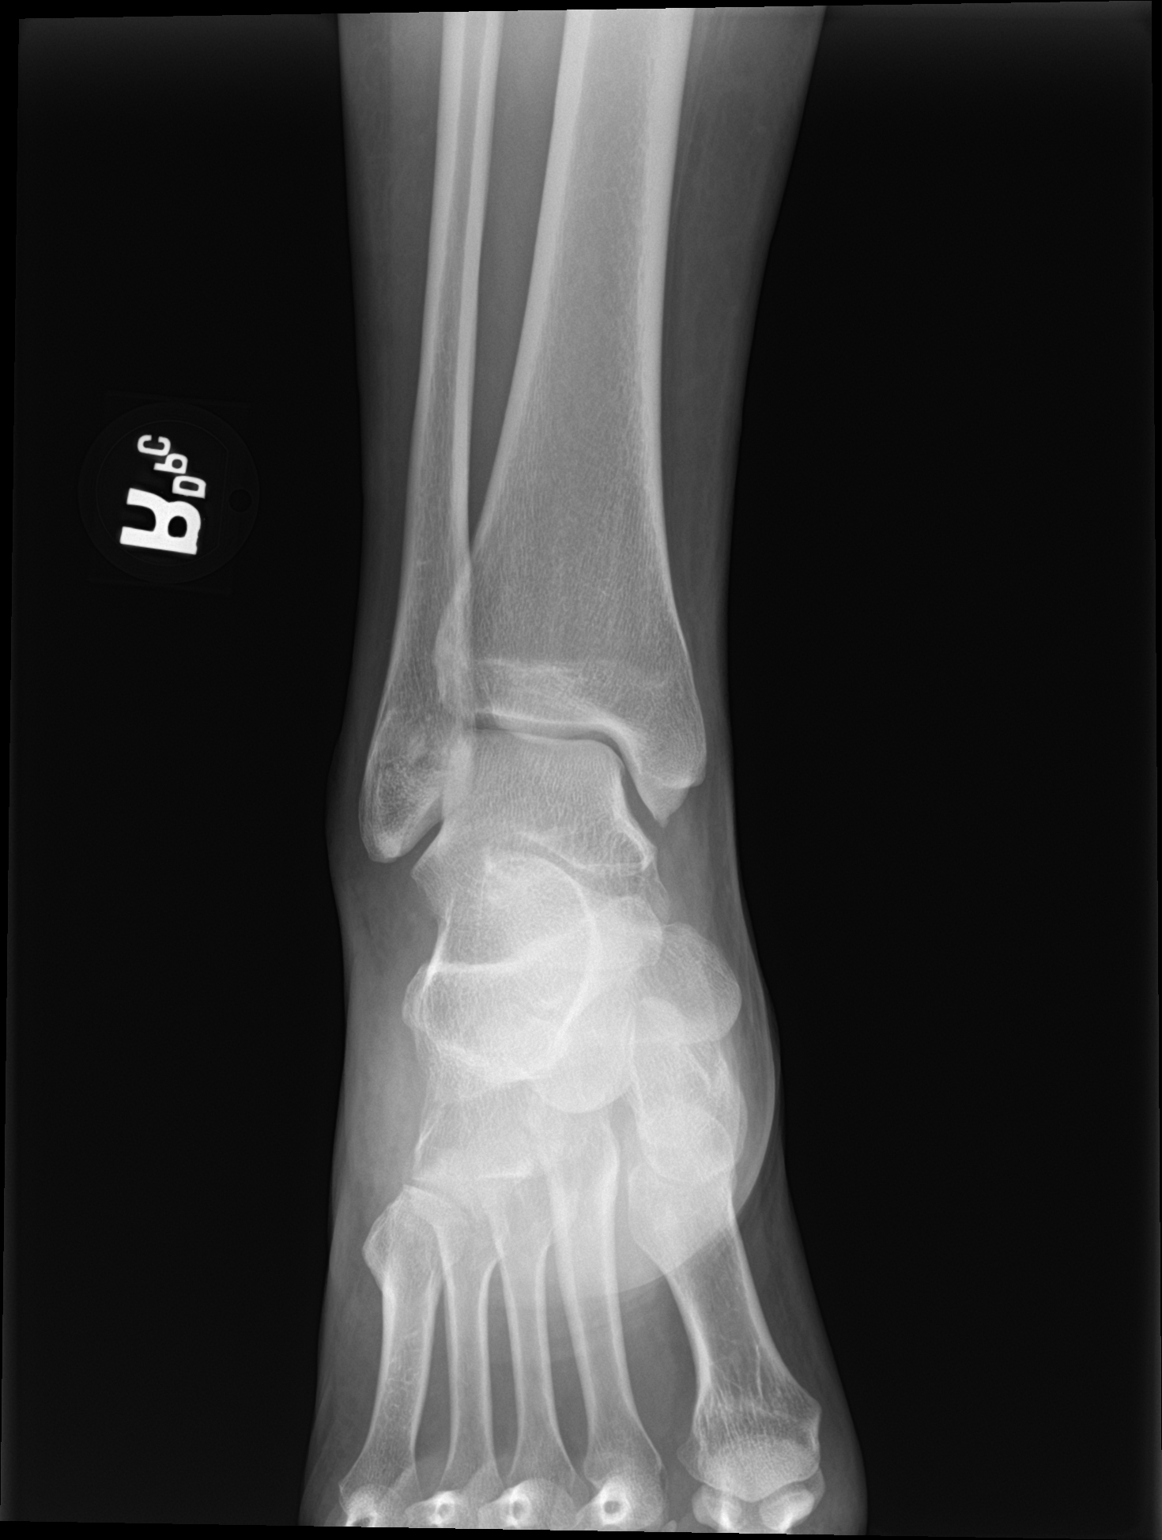
[im 2/3]
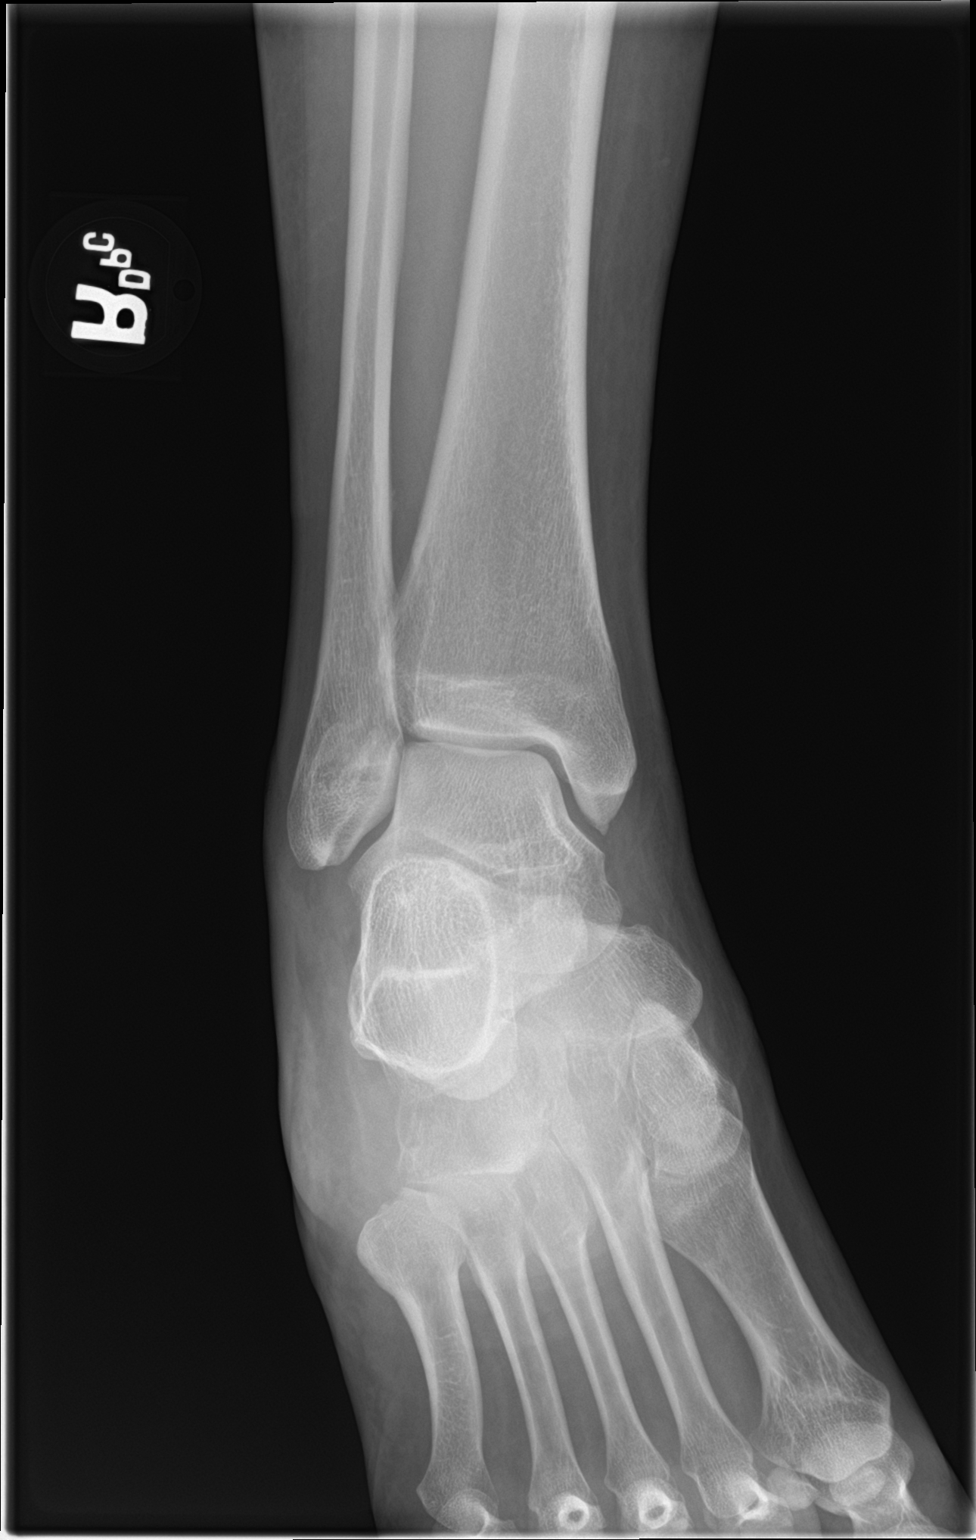
[im 3/3]
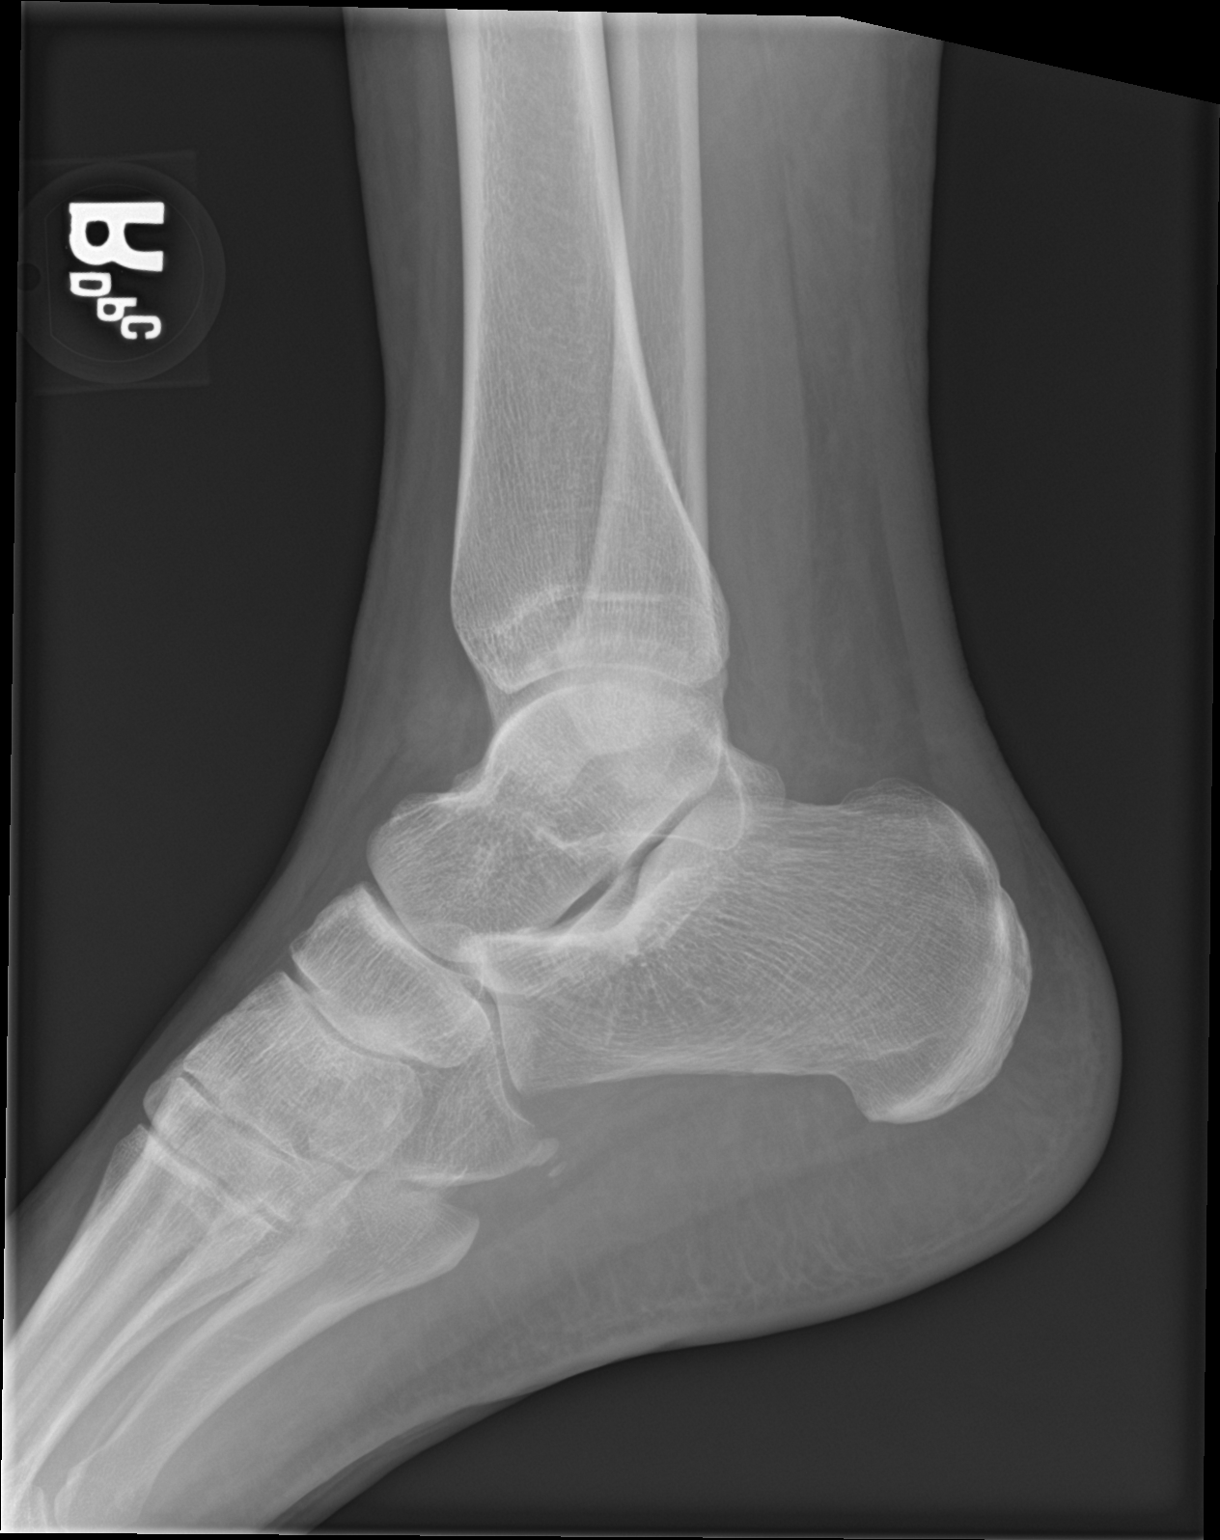

[3 of 3 positions shown; findings below may reference images not displayed]

FINDINGS: Frontal, oblique, lateral views of the right ankle are obtained. No
fracture, subluxation, or dislocation. Joint spaces are well
preserved. Soft tissues are unremarkable.
IMPRESSION: 1. Unremarkable right ankle.

## 2022-10-14 ENCOUNTER — Telehealth: Payer: Self-pay | Admitting: Internal Medicine

## 2022-10-14 NOTE — Telephone Encounter (Signed)
Pt called to see if Dr. Alphonsus Sias would send in a referral for a CT scan to see if she has any kidney issues? Pt's last ov with [facility] was 05/15/20. Pt states she saw [facility] on 07/23/21 for a pre-op appt, pt asked if [facility] couldn't send in CT scan, could Dugal? Pt's pcp is listed as Margarita Mail. Call back # [phone]

## 2022-10-14 NOTE — Telephone Encounter (Addendum)
Called pt back to tell her she needed to be seen. Advised Dr Alphonsus Sias was out of the office. I advised that Wyatt Mage saw her in March 2023 for a problem visit but pt still wants [place] to ask Tabitha. She said the UC Dr did not want to make her have to wait in the ER for a long time for a CT scan when her PCP could order it for her.   I advised that is not the usual protocol because proper documentation has to be done in case a pre-cert is needed. This is delaying her getting proper treatment.  UC note should be in chart or CareEverywhere

## 2022-10-14 NOTE — Telephone Encounter (Signed)
She needed to be triaged. Also, she would need an OV before anything like that could be scheduled. I will call her and get clarification about the PCP issue.

## 2022-10-15 NOTE — Telephone Encounter (Signed)
Per recent office visit with Dr. Chalmers Guest 6/2, urologist,  he did recommend outpatient Ct to discuss with pcp (if pt remained stable and or improved with symptoms) but he suspected in office possible renal calculi and gave RX to patient for tramadol, flomax, cipro and toradol. He did note to go to ER if symptoms worsened which sounds like they may have?  Otherwise f/u outpatient when stable with Dr. Alphonsus Sias to discuss this further. I only saw pt for that acute visit, and moving forward she needs to direct questiions to her PCP.

## 2022-10-15 NOTE — Telephone Encounter (Signed)
Spoke to pt. Advised her that [place] reviewed the notes from her Urgent Care visit on 6-2 (not urology). Advised her what Tabitha suggested. Advised if she was not having improvement to go to the ER for scanning or if she is stable, she can wait to discuss with Dr Alphonsus Sias since he is her primary care. She then hung up.

## 2022-10-18 ENCOUNTER — Encounter: Payer: Self-pay | Admitting: Internal Medicine

## 2022-10-18 ENCOUNTER — Ambulatory Visit: Payer: 59 | Admitting: Internal Medicine

## 2022-10-18 VITALS — BP 118/74 | HR 87 | Temp 98.3°F | Resp 16 | Ht 69.0 in | Wt 205.5 lb

## 2022-10-18 DIAGNOSIS — R7303 Prediabetes: Secondary | ICD-10-CM | POA: Diagnosis not present

## 2022-10-18 DIAGNOSIS — Z1231 Encounter for screening mammogram for malignant neoplasm of breast: Secondary | ICD-10-CM

## 2022-10-18 DIAGNOSIS — R5383 Other fatigue: Secondary | ICD-10-CM

## 2022-10-18 DIAGNOSIS — R3129 Other microscopic hematuria: Secondary | ICD-10-CM

## 2022-10-18 DIAGNOSIS — R109 Unspecified abdominal pain: Secondary | ICD-10-CM

## 2022-10-18 DIAGNOSIS — Z13220 Encounter for screening for lipoid disorders: Secondary | ICD-10-CM

## 2022-10-18 LAB — POCT URINALYSIS DIPSTICK
Bilirubin, UA: NEGATIVE
Blood, UA: POSITIVE
Glucose, UA: NEGATIVE
Ketones, UA: NEGATIVE
Leukocytes, UA: NEGATIVE
Nitrite, UA: NEGATIVE
Odor: NEGATIVE
Protein, UA: NEGATIVE
Spec Grav, UA: 1.015 (ref 1.010–1.025)
Urobilinogen, UA: 0.2 E.U./dL
pH, UA: 6 (ref 5.0–8.0)

## 2022-10-18 NOTE — Progress Notes (Signed)
New Patient Office Visit  Subjective    Patient ID: Paula Page, female    DOB: 1980-08-26  Age: 42 y.o. MRN: [id number]  CC:  Chief Complaint  Patient presents with   Establish Care   Flank Pain    Right still having pain since urgent care, they recommend CT scan   Labs Only    Pre diabetes last yr A1c was 6.3    HPI Paula Page presents to establish care. She has no chronic medical conditions and takes no daily medications other than 2 supplements.   Pre-Diabetes: -A1c 6.3% 9/23, diagnosed at [facility] -Not currently on medications -Has a strong family history of DM  Right Flank Pain:  -Has had chronic microscopic hematuria since 42 years old, saw Urology in 2022 who did not recommend any further work up at that time -Went to [facility] on 10/13/22 with right flank pain, UA there showed moderate blood, negative nitrate, negative leukocytes -Was treated with Cipro BID x 7 days  -Pain now a 7/10, pain better with laying down, worse with walking -No dysuria, no gross hematuria, pushing fluids   Health Maintenance: -Blood work due  -Mammogram due -Pap 6/23 negative, HPV positive   Outpatient Encounter Medications as of 10/18/2022  Medication Sig   ASHWAGANDHA PO Take by mouth at bedtime.   ciprofloxacin (CIPRO) 250 MG tablet Take 250 mg by mouth 2 (two) times daily.   co-enzyme Q-10 30 MG capsule Take 30 mg by mouth 3 (three) times daily.   traMADol (ULTRAM) 50 MG tablet Take 50 mg by mouth every 6 (six) hours as needed.   [DISCONTINUED] doxycycline (VIBRAMYCIN) 100 MG capsule Take 1 capsule (100 mg total) by mouth 2 (two) times daily.   [DISCONTINUED] ondansetron (ZOFRAN) 4 MG tablet Take 1 tablet (4 mg total) by mouth every 8 (eight) hours as needed for nausea or vomiting.   No facility-administered encounter medications on file as of 10/18/2022.    Past Medical History:  Diagnosis Date   Abnormal uterine bleeding (AUB) 09/28/2018   GERD (gastroesophageal reflux disease)     during pregnancy and before chole   Wisdom teeth extracted     Past Surgical History:  Procedure Laterality Date   ANKLE RECONSTRUCTION Right 07/26/2021   Procedure: RECONSTRUCTION ANKLE;  Surgeon: Rosetta Posner, DPM;  Location: [facility];  Service: Podiatry;  Laterality: Right;  Pop stafphblock with experal   CHOLECYSTECTOMY  09/23/13   IRRIGATION AND DEBRIDEMENT SEBACEOUS CYST     WOUND DEBRIDEMENT Right 07/26/2021   Procedure: A-SCOPE/DEBRIDEMENT; EXTENSIVE;  Surgeon: Rosetta Posner, DPM;  Location: [facility];  Service: Podiatry;  Laterality: Right;    Family History  Problem Relation Age of Onset   Stroke Mother    Hypertension Mother    Hyperlipidemia Father    Diabetes Paternal Uncle    Diabetes Paternal Grandmother    Cancer Paternal Grandfather 2       stomach cancer   Alcohol abuse Paternal Grandfather     Social History   Socioeconomic History   Marital status: Single    Spouse name: Not on file   Number of children: 3   Years of education: Not on file   Highest education level: Not on file  Occupational History   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    Comment: [employer] [place]  Tobacco Use   Smoking status: Never   Smokeless tobacco: Never  Vaping Use   Vaping Use: Never used  Substance and Sexual Activity  Alcohol use: Yes    Comment: occas   Drug use: No   Sexual activity: Yes  Other Topics Concern   Not on file  Social History Narrative   Single   3 children   2 different fathers --both involved in care   Social Determinants of Health   Financial Resource Strain: Not on file  Food Insecurity: Not on file  Transportation Needs: Not on file  Physical Activity: Not on file  Stress: Not on file  Social Connections: Not on file  Intimate Partner Violence: Not on file    Review of Systems  Constitutional:  Negative for chills and fever.  Genitourinary:  Positive for flank pain. Negative for dysuria, frequency, hematuria and  urgency.        Objective    BP 118/74   Pulse 87   Temp 98.3 F (36.8 C)   Resp 16   Ht 5\' 9"  (1.753 m)   Wt 205 lb 8 oz (93.2 kg)   LMP 10/05/2022   SpO2 100%   BMI 30.35 kg/m   Physical Exam Constitutional:      Appearance: Normal appearance.  HENT:     Head: Normocephalic and atraumatic.     Mouth/Throat:     Mouth: Mucous membranes are moist.     Pharynx: Oropharynx is clear.  Eyes:     Extraocular Movements: Extraocular movements intact.     Conjunctiva/sclera: Conjunctivae normal.     Pupils: Pupils are equal, round, and reactive to light.  Neck:     Comments: No thyromegaly  Cardiovascular:     Rate and Rhythm: Normal rate and regular rhythm.  Pulmonary:     Effort: Pulmonary effort is normal.     Breath sounds: Normal breath sounds.  Abdominal:     Tenderness: There is right CVA tenderness. There is no left CVA tenderness.  Musculoskeletal:     Cervical back: No tenderness.  Lymphadenopathy:     Cervical: No cervical adenopathy.  Skin:    General: Skin is warm and dry.  Neurological:     General: No focal deficit present.     Mental Status: She is alert. Mental status is at baseline.  Psychiatric:        Mood and Affect: Mood normal.        Behavior: Behavior normal.         Assessment & Plan:   1. Right flank pain/Microscopic hematuria: Has chronic trace microscopic hematuria which was increased last week. Treated with Cipro, UA here today negative with exception of blood. Obtain CT renal stone study and labs. Urology note reviewed from 06/12/20.  - CT RENAL STONE STUDY; Future - POCT Urinalysis Dipstick - CBC w/Diff/Platelet - COMPLETE METABOLIC PANEL WITH GFR  2. Prediabetes: Check A1c today, was 6.3% in September.   - CBC w/Diff/Platelet - COMPLETE METABOLIC PANEL WITH GFR - HgB J4N  3. Fatigue, unspecified type: Check labs, TSH.   - CBC w/Diff/Platelet - COMPLETE METABOLIC PANEL WITH GFR - TSH  4. Lipid screening: Strong  family history of diabetes and her mother has had multiple strokes starting in her 31's. Will check lipid panel today.   - Lipid Profile  5. Encounter for screening mammogram for malignant neoplasm of breast: Mammogram ordered.   - MM 3D SCREENING MAMMOGRAM BILATERAL BREAST; Future   Return in about 6 months (around 04/19/2023).   Margarita Mail, DO

## 2022-10-19 LAB — CBC WITH DIFFERENTIAL/PLATELET
Absolute Monocytes: 683 cells/uL (ref 200–950)
Basophils Absolute: 40 cells/uL (ref 0–200)
Basophils Relative: 0.6 %
Eosinophils Absolute: 40 cells/uL (ref 15–500)
Eosinophils Relative: 0.6 %
HCT: 39 % (ref 35.0–45.0)
Hemoglobin: 12.6 g/dL (ref 11.7–15.5)
Lymphs Abs: 2265 cells/uL (ref 850–3900)
MCH: 27.6 pg (ref 27.0–33.0)
MCHC: 32.3 g/dL (ref 32.0–36.0)
MCV: 85.3 fL (ref 80.0–100.0)
MPV: 9.3 fL (ref 7.5–12.5)
Monocytes Relative: 10.2 %
Neutro Abs: 3672 cells/uL (ref 1500–7800)
Neutrophils Relative %: 54.8 %
Platelets: 321 10*3/uL (ref 140–400)
RBC: 4.57 10*6/uL (ref 3.80–5.10)
RDW: 13.5 % (ref 11.0–15.0)
Total Lymphocyte: 33.8 %
WBC: 6.7 10*3/uL (ref 3.8–10.8)

## 2022-10-19 LAB — LIPID PANEL
Cholesterol: 211 mg/dL — ABNORMAL HIGH (ref <200)
HDL: 59 mg/dL (ref >=50)
LDL Cholesterol (Calc): 129 mg/dL (calc) — ABNORMAL HIGH
Non-HDL Cholesterol (Calc): 152 mg/dL (calc) — ABNORMAL HIGH (ref <130)
Total CHOL/HDL Ratio: 3.6 (calc) (ref <5.0)
Triglycerides: 122 mg/dL (ref <150)

## 2022-10-19 LAB — COMPLETE METABOLIC PANEL WITH GFR
AG Ratio: 1.4 (calc) (ref 1.0–2.5)
ALT: 17 U/L (ref 6–29)
AST: 17 U/L (ref 10–30)
Albumin: 4.2 g/dL (ref 3.6–5.1)
Alkaline phosphatase (APISO): 65 U/L (ref 31–125)
BUN: 12 mg/dL (ref 7–25)
CO2: 27 mmol/L (ref 20–32)
Calcium: 8.5 mg/dL — ABNORMAL LOW (ref 8.6–10.2)
Chloride: 104 mmol/L (ref 98–110)
Creat: 0.86 mg/dL (ref 0.50–0.99)
Globulin: 2.9 g/dL (calc) (ref 1.9–3.7)
Glucose, Bld: 80 mg/dL (ref 65–99)
Potassium: 4 mmol/L (ref 3.5–5.3)
Sodium: 139 mmol/L (ref 135–146)
Total Bilirubin: 0.4 mg/dL (ref 0.2–1.2)
Total Protein: 7.1 g/dL (ref 6.1–8.1)
eGFR: 86 mL/min/{1.73_m2} (ref >=60)

## 2022-10-19 LAB — HEMOGLOBIN A1C
Hgb A1c MFr Bld: 6.3 % of total Hgb — ABNORMAL HIGH (ref <5.7)
Mean Plasma Glucose: 134 mg/dL
eAG (mmol/L): 7.4 mmol/L

## 2022-10-19 LAB — TSH: TSH: 1.88 mIU/L

## 2022-10-25 ENCOUNTER — Ambulatory Visit
Admission: RE | Admit: 2022-10-25 | Discharge: 2022-10-25 | Disposition: A | Discharge status: Home / Self Care | Payer: 59 | Source: Ambulatory Visit | Attending: Internal Medicine | Admitting: Internal Medicine

## 2022-10-25 DIAGNOSIS — R3129 Other microscopic hematuria: Secondary | ICD-10-CM

## 2022-10-25 DIAGNOSIS — R109 Unspecified abdominal pain: Secondary | ICD-10-CM | POA: Diagnosis present

## 2022-11-21 ENCOUNTER — Ambulatory Visit
Admission: RE | Admit: 2022-11-21 | Discharge: 2022-11-21 | Disposition: A | Discharge status: Home / Self Care | Payer: 59 | Source: Ambulatory Visit | Attending: Internal Medicine | Admitting: Internal Medicine

## 2022-11-21 DIAGNOSIS — Z1231 Encounter for screening mammogram for malignant neoplasm of breast: Secondary | ICD-10-CM | POA: Insufficient documentation

## 2023-01-21 NOTE — Progress Notes (Unsigned)
Established Patient Office Visit  Subjective    Patient ID: Paula Page, female    DOB: 1980/08/28  Age: 42 y.o. MRN: [id number]  CC:  No chief complaint on file.   HPI Paula Page presents to follow up on chronic medical conditions.   Pre-Diabetes: -A1c 6.3% 6/24 -Not currently on medications -Has a strong family history of DM  Right Flank Pain:  -Has had chronic microscopic hematuria since 42 years old, saw Urology in 2022 who did not recommend any further work up at that time -Went to [facility] on 10/13/22 with right flank pain, UA there showed moderate blood, negative nitrate, negative leukocytes -Was treated with Cipro BID x 7 days  -Pain now a 7/10, pain better with laying down, worse with walking -No dysuria, no gross hematuria, pushing fluids   Health Maintenance: -Blood work UTD -Mammogram 7/24 Birads-1 -Pap 6/23 negative, HPV positive   Outpatient Encounter Medications as of 01/22/2023  Medication Sig   ASHWAGANDHA PO Take by mouth at bedtime.   co-enzyme Q-10 30 MG capsule Take 30 mg by mouth 3 (three) times daily.   traMADol (ULTRAM) 50 MG tablet Take 50 mg by mouth every 6 (six) hours as needed.   No facility-administered encounter medications on file as of 01/22/2023.    Past Medical History:  Diagnosis Date   Abnormal uterine bleeding (AUB) 09/28/2018   GERD (gastroesophageal reflux disease)    during pregnancy and before chole   Wisdom teeth extracted     Past Surgical History:  Procedure Laterality Date   ANKLE RECONSTRUCTION Right 07/26/2021   Procedure: RECONSTRUCTION ANKLE;  Surgeon: Rosetta Posner, DPM;  Location: [facility];  Service: Podiatry;  Laterality: Right;  Pop stafphblock with experal   CHOLECYSTECTOMY  09/23/13   IRRIGATION AND DEBRIDEMENT SEBACEOUS CYST     WOUND DEBRIDEMENT Right 07/26/2021   Procedure: A-SCOPE/DEBRIDEMENT; EXTENSIVE;  Surgeon: Rosetta Posner, DPM;  Location: [facility];  Service: Podiatry;  Laterality:  Right;    Family History  Problem Relation Age of Onset   Stroke Mother    Hypertension Mother    Hyperlipidemia Father    Diabetes Paternal Uncle    Diabetes Paternal Grandmother    Cancer Paternal Grandfather 84       stomach cancer   Alcohol abuse Paternal Grandfather     Social History   Socioeconomic History   Marital status: Single    Spouse name: Not on file   Number of children: 3   Years of education: Not on file   Highest education level: Not on file  Occupational History   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    Comment: [employer] [place]  Tobacco Use   Smoking status: Never   Smokeless tobacco: Never  Vaping Use   Vaping status: Never Used  Substance and Sexual Activity   Alcohol use: Yes    Comment: occas   Drug use: No   Sexual activity: Yes  Other Topics Concern   Not on file  Social History Narrative   Single   3 children   2 different fathers --both involved in care   Social Determinants of Health   Financial Resource Strain: Not on file  Food Insecurity: Not on file  Transportation Needs: Not on file  Physical Activity: Not on file  Stress: Not on file  Social Connections: Not on file  Intimate Partner Violence: Not on file    Review of Systems  Constitutional:  Negative for chills and fever.  Genitourinary:  Positive for flank pain. Negative for dysuria, frequency, hematuria and urgency.        Objective    There were no vitals taken for this visit.  Physical Exam Constitutional:      Appearance: Normal appearance.  HENT:     Head: Normocephalic and atraumatic.     Mouth/Throat:     Mouth: Mucous membranes are moist.     Pharynx: Oropharynx is clear.  Eyes:     Extraocular Movements: Extraocular movements intact.     Conjunctiva/sclera: Conjunctivae normal.     Pupils: Pupils are equal, round, and reactive to light.  Neck:     Comments: No thyromegaly  Cardiovascular:     Rate and Rhythm: Normal rate and regular rhythm.   Pulmonary:     Effort: Pulmonary effort is normal.     Breath sounds: Normal breath sounds.  Abdominal:     Tenderness: There is right CVA tenderness. There is no left CVA tenderness.  Musculoskeletal:     Cervical back: No tenderness.  Lymphadenopathy:     Cervical: No cervical adenopathy.  Skin:    General: Skin is warm and dry.  Neurological:     General: No focal deficit present.     Mental Status: She is alert. Mental status is at baseline.  Psychiatric:        Mood and Affect: Mood normal.        Behavior: Behavior normal.         Assessment & Plan:   1. Right flank pain/Microscopic hematuria: Has chronic trace microscopic hematuria which was increased last week. Treated with Cipro, UA here today negative with exception of blood. Obtain CT renal stone study and labs. Urology note reviewed from 06/12/20.  - CT RENAL STONE STUDY; Future - POCT Urinalysis Dipstick - CBC w/Diff/Platelet - COMPLETE METABOLIC PANEL WITH GFR  2. Prediabetes: Check A1c today, was 6.3% in September.   - CBC w/Diff/Platelet - COMPLETE METABOLIC PANEL WITH GFR - HgB Z6X  3. Fatigue, unspecified type: Check labs, TSH.   - CBC w/Diff/Platelet - COMPLETE METABOLIC PANEL WITH GFR - TSH  4. Lipid screening: Strong family history of diabetes and her mother has had multiple strokes starting in her 68's. Will check lipid panel today.   - Lipid Profile  5. Encounter for screening mammogram for malignant neoplasm of breast: Mammogram ordered.   - MM 3D SCREENING MAMMOGRAM BILATERAL BREAST; Future   No follow-ups on file.   Margarita Mail, DO

## 2023-01-22 ENCOUNTER — Encounter: Payer: Self-pay | Admitting: Internal Medicine

## 2023-01-22 ENCOUNTER — Ambulatory Visit: Payer: 59 | Admitting: Internal Medicine

## 2023-01-22 VITALS — BP 118/72 | HR 76 | Temp 98.4°F | Resp 18 | Ht 69.0 in | Wt 198.9 lb

## 2023-01-22 DIAGNOSIS — L245 Irritant contact dermatitis due to other chemical products: Secondary | ICD-10-CM | POA: Diagnosis not present

## 2023-01-22 DIAGNOSIS — R7303 Prediabetes: Secondary | ICD-10-CM | POA: Diagnosis not present

## 2023-01-22 DIAGNOSIS — E782 Mixed hyperlipidemia: Secondary | ICD-10-CM | POA: Diagnosis not present

## 2023-01-22 LAB — POCT GLYCOSYLATED HEMOGLOBIN (HGB A1C): Hemoglobin A1C: 6.1 % — AB (ref 4.0–5.6)

## 2023-01-22 NOTE — Patient Instructions (Signed)
Contact Dermatitis Dermatitis is redness, soreness, and swelling (inflammation) of the skin. Contact dermatitis is a reaction to certain substances that touch the skin. There are two types of this condition: Irritant contact dermatitis. This is the most common type. It happens when something irritates your skin, such as when your hands get dry from washing them too often with soap. You can get this type of reaction even if you have not been exposed to the irritant before. Allergic contact dermatitis. This type is caused by a substance that you are allergic to, such as poison ivy. It occurs when you have been exposed to the substance (allergen) and form a sensitivity to it. In some cases, the reaction may start soon after your first exposure to the allergen. In other cases, it may not start until you are exposed to the allergen again. It may then occur every time you are exposed to the allergen in the future. What are the causes? Irritant contact dermatitis is often caused by exposure to: Makeup. Soaps, detergents, and bleaches. Acids. Metal salts, such as nickel. Allergic contact dermatitis is often caused by exposure to: Poisonous plants. Chemicals. Jewelry. Latex. Medicines. Preservatives in products, such as clothes. What increases the risk? You are more likely to get this condition if you have: A job that exposes you to irritants or allergens. Certain medical conditions. These include asthma and eczema. What are the signs or symptoms? Symptoms of this condition may occur in any place on your body that has been touched by the irritant. Symptoms include: Dryness, flaking, or cracking. Redness. Itching. Pain or a burning feeling. Blisters. Drainage of small amounts of blood or clear fluid from skin cracks. With allergic contact dermatitis, there may also be swelling in areas such as the eyelids, mouth, or genitals. How is this diagnosed? This condition is diagnosed with a medical  history and physical exam. A patch skin test may be done to help figure out the cause. If the condition is related to your job, you may need to see an expert in health problems in the workplace (occupational medicine specialist). How is this treated? This condition is treated by staying away from the cause of the reaction and protecting your skin from further contact. Treatment may also include: Steroid creams or ointments. Steroid medicines may need be taken by mouth (orally) in more severe cases. Antibiotics or medicines applied to the skin to kill bacteria (antibacterial ointments). These may be needed if a skin infection is present. Antihistamines. These may be taken orally or put on as a lotion to ease itching. A bandage (dressing). Follow these instructions at home: Skin care Moisturize your skin as needed. Put cool, wet cloths (cool compresses) on the affected areas. Try applying baking soda paste to your skin. Stir water into baking soda until it has the consistency of a paste. Do not scratch your skin. Avoid friction to the affected area. Avoid the use of soaps, perfumes, and dyes. Check the affected areas every day for signs of infection. Check for: More redness, swelling, or pain. More fluid or blood. Warmth. Pus or a bad smell. Medicines Take or apply over-the-counter and prescription medicines only as told by your health care provider. If you were prescribed antibiotics, take or apply them as told by your health care provider. Do not stop using the antibiotic even if you start to feel better. Bathing Try taking a bath with: Epsom salts. Follow the instructions on the packaging. You can get these at your local pharmacy   or grocery store. Baking soda. Pour a small amount into the bath as told by your health care provider. Colloidal oatmeal. Follow the instructions on the packaging. You can get this at your local pharmacy or grocery store. Bathe less often. This may mean bathing  every other day. Bathe in lukewarm water. Avoid using hot water. Bandage care If you were given a dressing, change it as told by your health care provider. Wash your hands with soap and water for at least 20 seconds before and after you change your dressing. If soap and water are not available, use hand sanitizer. General instructions Avoid the substance that caused your reaction. If you do not know what caused it, keep a journal to try to track what caused it. Write down: What you eat and drink. What cosmetics you use. What you wear in the affected area. This includes jewelry. Contact a health care provider if: Your condition does not get better with treatment. Your condition gets worse. You have any signs of infection. You have a fever. You have new symptoms. Your bone or joint under the affected area becomes painful after the skin has healed. Get help right away if: You notice red streaks coming from the affected area. The affected area turns darker. You have trouble breathing. This information is not intended to replace advice given to you by your health care provider. Make sure you discuss any questions you have with your health care provider. Document Revised: 11/02/2021 Document Reviewed: 11/02/2021 Elsevier Patient Education  2024 [employer]  

## 2023-03-07 ENCOUNTER — Ambulatory Visit: Payer: Self-pay | Admitting: *Deleted

## 2023-03-07 NOTE — Telephone Encounter (Signed)
  Chief Complaint: Humphrey Rolls during sleep Symptoms: patient states her boyfriend woke her- she was jerking and shaking during sleep- patient report her muscles are tight and sore on the right side, she has headache on right side also Frequency: distant hx of shaking in sleep before Pertinent Negatives: Patient denies diagnosis of seizure  Disposition: [x] ED /[] Urgent Care (no appt availability in office) / [] Appointment(In office/virtual)/ []  [facility]/ [] Home Care/ [] Refused Recommended Disposition /[] [facility]/ []  Follow-up with PCP Additional Notes: Patient is established with neurology and advised to contact her provider- also advised if they can not see her ED is advised.    Reason for Disposition  [1] New-onset muscle jerks AND [2] episode lasts > 1 minute AND [3] resolved  Answer Assessment - Initial Assessment Questions 1. APPEARANCE of MOVEMENT: "What did the jerking or twitching look like?" (e.g., body area)     Shaking and jerking during sleep- boyfriend woke her- patient could hear him- patient was dreaming- she was in state of fight/flight 2. ONSET: "When did this start happening?" (e.g., hours, days, weeks, months ago)     Last night 3-4 am 3. DURATION: "How long does the jerk, twitch, or spasm last?"     unsure 4. FREQUENCY:  "How often does this happen?"      Years ago 5. WHEN: "When does this happen?" (e.g., while awake, while falling asleep, while sleeping)     Years ago- during sleep 6. CAUSE: "What do you think caused the jerking?"     seizure 7. OTHER SYMPTOMS: "Are there any other symptoms?" (e.g., fever, headache)     Lingering muscle tightness- right side, lower back- R pain, headache- R/top  Protocols used: Muscle Jerks - Tics - [facility]

## 2023-03-19 ENCOUNTER — Other Ambulatory Visit: Payer: Self-pay

## 2023-03-19 ENCOUNTER — Emergency Department
Admission: EM | Admit: 2023-03-19 | Discharge: 2023-03-19 | Disposition: A | Discharge status: Home / Self Care | Payer: 59 | Attending: Emergency Medicine | Admitting: Emergency Medicine

## 2023-03-19 ENCOUNTER — Emergency Department: Payer: 59

## 2023-03-19 ENCOUNTER — Encounter: Payer: Self-pay | Admitting: Intensive Care

## 2023-03-19 ENCOUNTER — Ambulatory Visit: Payer: Self-pay

## 2023-03-19 DIAGNOSIS — I10 Essential (primary) hypertension: Secondary | ICD-10-CM | POA: Insufficient documentation

## 2023-03-19 DIAGNOSIS — G479 Sleep disorder, unspecified: Secondary | ICD-10-CM | POA: Insufficient documentation

## 2023-03-19 DIAGNOSIS — R258 Other abnormal involuntary movements: Secondary | ICD-10-CM | POA: Diagnosis not present

## 2023-03-19 DIAGNOSIS — R519 Headache, unspecified: Secondary | ICD-10-CM | POA: Diagnosis present

## 2023-03-19 DIAGNOSIS — R569 Unspecified convulsions: Secondary | ICD-10-CM

## 2023-03-19 LAB — COMPREHENSIVE METABOLIC PANEL
ALT: 16 U/L (ref 0–44)
AST: 17 U/L (ref 15–41)
Albumin: 3.6 g/dL (ref 3.5–5.0)
Alkaline Phosphatase: 59 U/L (ref 38–126)
Anion gap: 8 (ref 5–15)
BUN: 11 mg/dL (ref 6–20)
CO2: 25 mmol/L (ref 22–32)
Calcium: 8.5 mg/dL — ABNORMAL LOW (ref 8.9–10.3)
Chloride: 105 mmol/L (ref 98–111)
Creatinine, Ser: 0.82 mg/dL (ref 0.44–1.00)
GFR, Estimated: >60 mL/min (ref >60)
Glucose, Bld: 115 mg/dL — ABNORMAL HIGH (ref 70–99)
Potassium: 3.6 mmol/L (ref 3.5–5.1)
Sodium: 138 mmol/L (ref 135–145)
Total Bilirubin: 0.6 mg/dL (ref <1.2)
Total Protein: 7 g/dL (ref 6.5–8.1)

## 2023-03-19 LAB — CBC WITH DIFFERENTIAL/PLATELET
Abs Immature Granulocytes: 0.01 10*3/uL (ref 0.00–0.07)
Basophils Absolute: 0 10*3/uL (ref 0.0–0.1)
Basophils Relative: 0 %
Eosinophils Absolute: 0 10*3/uL (ref 0.0–0.5)
Eosinophils Relative: 1 %
HCT: 37.6 % (ref 36.0–46.0)
Hemoglobin: 12.2 g/dL (ref 12.0–15.0)
Immature Granulocytes: 0 %
Lymphocytes Relative: 34 %
Lymphs Abs: 2.4 10*3/uL (ref 0.7–4.0)
MCH: 27.5 pg (ref 26.0–34.0)
MCHC: 32.4 g/dL (ref 30.0–36.0)
MCV: 84.7 fL (ref 80.0–100.0)
Monocytes Absolute: 0.5 10*3/uL (ref 0.1–1.0)
Monocytes Relative: 8 %
Neutro Abs: 4 10*3/uL (ref 1.7–7.7)
Neutrophils Relative %: 57 %
Platelets: 306 10*3/uL (ref 150–400)
RBC: 4.44 MIL/uL (ref 3.87–5.11)
RDW: 14.3 % (ref 11.5–15.5)
WBC: 6.9 10*3/uL (ref 4.0–10.5)
nRBC: 0 % (ref 0.0–0.2)

## 2023-03-19 LAB — URINALYSIS, ROUTINE W REFLEX MICROSCOPIC
Bilirubin Urine: NEGATIVE
Glucose, UA: NEGATIVE mg/dL
Ketones, ur: NEGATIVE mg/dL
Leukocytes,Ua: NEGATIVE
Nitrite: NEGATIVE
Protein, ur: NEGATIVE mg/dL
Specific Gravity, Urine: 1.017 (ref 1.005–1.030)
pH: 5 (ref 5.0–8.0)

## 2023-03-19 LAB — MAGNESIUM: Magnesium: 2 mg/dL (ref 1.7–2.4)

## 2023-03-19 LAB — POC URINE PREG, ED: Preg Test, Ur: NEGATIVE

## 2023-03-19 LAB — CBG MONITORING, ED: Glucose-Capillary: 115 mg/dL — ABNORMAL HIGH (ref 70–99)

## 2023-03-19 NOTE — Discharge Instructions (Addendum)
Fortunately your testing in the emergency department did not show any emergency conditions that might account for your symptoms.  Please follow-up with your neurologist, primary doctor, and inquire about a sleep study to further investigate what might be causing your symptoms.  Thank you for choosing [place] for your health care today!  Please see your primary doctor this week for a follow up appointment.   If you have any new, worsening, or unexpected symptoms call your doctor right away or come back to the emergency department for reevaluation.  It was my pleasure to care for you today.   Daneil Dan [place] Charon, MD

## 2023-03-19 NOTE — ED Triage Notes (Addendum)
Patient drove self to hospital. Patient believes she had a seizure in her sleep. Reports waking up with heavy legs, stiff back, and headache. Also reports right eye pain  No history of seizures. Patient reports scheduled EEG for December  A&O x4 during triage.   Patient reports being pre diabetic

## 2023-03-19 NOTE — Telephone Encounter (Signed)
Chief Complaint: Jerking activity  Symptoms: muscle spasm, jerking in her sleep, headache, dry mouth, body aches Frequency: Comes and goes  Pertinent Negatives: Patient denies chest pain, vomiting, fever Disposition: [] ED /[] Urgent Care (no appt availability in office) / [x] Appointment(In office/virtual)/ []  [facility]/ [] Home Care/ [] Refused Recommended Disposition /[] [place] [facility]/ []  Follow-up with PCP Additional Notes: Patient states she has been experiencing body jerking in her sleep. Waking up with a headache, dry mouth, body aches and feeling bad overall. Patient states she has been seen by a neurologist but has not been diagnosed with seizures. Patient states some providers try to say it is a mental problem not physical but she does not believe that to be true. Patient went to the ED this morning and stated they did not do much for her. Patient reports a low calcium level on lab results and wants to know why it was not addressed with her. Care advice was given and patient stated she has not seen PCP for these symptoms before. Patient has been scheduled with PCP tomorrow at 1340. Advised patient to go back to ED if symptoms get worse. Patient verbalized understanding.  Reason for Disposition  Muscle jerks, tics, or shudders are a chronic symptom (recurrent or ongoing AND present > 4 weeks)  Answer Assessment - Initial Assessment Questions 1. APPEARANCE of MOVEMENT: "What did the jerking or twitching look like?" (e.g., body area)     My full body is jerking in my sleep last night  2. ONSET: "When did this start happening?" (e.g., hours, days, weeks, months ago)     Last night  3. DURATION: "How long does the jerk, twitch, or spasm last?"     I'm not sure  4. FREQUENCY:  "How often does this happen?"      I feel like it is happening more frequently than before  5. WHEN: "When does this happen?" (e.g., while awake, while falling asleep, while sleeping)     While sleeping   6. CAUSE: "What do you think caused the jerking?"     I don't know maybe seizure  7. OTHER SYMPTOMS: "Are there any other symptoms?" (e.g., fever, headache)     Memory loss, headache, dry mouth, body jerking at night, muscle spasm, neck stiffness, back pain  Protocols used: Muscle Jerks - Tics - [facility]

## 2023-03-19 NOTE — ED Notes (Signed)
See triage notes. Patient believes she has a seizure in her sleep. Patient stated "she was dreaming and felt like she was trying to move but that something was weighing her down," and then woke up with a headache, stiff neck and stiff back. Patient is scheduled for an EEG in December.

## 2023-03-19 NOTE — ED Provider Notes (Signed)
[facility] Provider Note    Event Date/Time   First MD Initiated Contact with Patient 03/19/23 [phone]     (approximate)   History   No chief complaint on file.   HPI  Paula Page is a 42 y.o. female   Past medical history of no significant past medical history who presents to Emergency Department with sleep disturbance.  While sleeping this morning she had a sensation that she was awake and conscious but could not move her body.  She awoke having soreness throughout her body and a right-sided headache.  She had similar occurrences in the past, and last time she was sleeping with her boyfriend and her boyfriend noted jerking movements of her legs.  She was subsequently referred to neurology who saw her in clinic, with plans for EEG and muscle stimulation testing.  She reports no recent illnesses, trauma, and feels back to baseline now except for some muscular soreness everywhere.  No drug or alcohol use.  No significant caffeine use.   External Medical Documents Reviewed: Neurology note from Dr. Daisy Blossom office on 03/10/2023 for seizure-like activity and workup plan as above      Physical Exam   Triage Vital Signs: ED Triage Vitals [03/19/23 0739]  Encounter Vitals Group     BP (!) 162/92     Systolic BP Percentile      Diastolic BP Percentile      Pulse Rate 85     Resp 16     Temp 98.7 F (37.1 C)     Temp Source Oral     SpO2 100 %     Weight 200 lb (90.7 kg)     Height 5\' 9"  (1.753 m)     Head Circumference      Peak Flow      Pain Score 7     Pain Loc      Pain Education      Exclude from Growth Chart     Most recent vital signs: Vitals:   03/19/23 0739 03/19/23 0845  BP: (!) 162/92 (!) 146/74  Pulse: 85 89  Resp: 16   Temp: 98.7 F (37.1 C)   SpO2: 100% 100%    General: Awake, no distress.  CV:  Good peripheral perfusion.  Resp:  Normal effort.  Abd:  No distention.  Other:  Pleasant woman no acute distress mildly  hypertensive otherwise vital signs are normal.  No dysarthria facial asymmetry finger-to-nose is normal, no motor or sensory deficits.  Neck supple with full range of motion.    ED Results / Procedures / Treatments   Labs (all labs ordered are listed, but only abnormal results are displayed) Labs Reviewed  COMPREHENSIVE METABOLIC PANEL - Abnormal; Notable for the following components:      Result Value   Glucose, Bld 115 (*)    Calcium 8.5 (*)    All other components within normal limits  URINALYSIS, ROUTINE W REFLEX MICROSCOPIC - Abnormal; Notable for the following components:   Color, Urine YELLOW (*)    APPearance CLEAR (*)    Hgb urine dipstick MODERATE (*)    Bacteria, UA RARE (*)    All other components within normal limits  CBG MONITORING, ED - Abnormal; Notable for the following components:   Glucose-Capillary 115 (*)    All other components within normal limits  CBC WITH DIFFERENTIAL/PLATELET  MAGNESIUM  POC URINE PREG, ED     I ordered and reviewed the above labs  they are notable for blood sugar is normal  EKG  ED ECG REPORT I, Pilar Jarvis, the attending physician, personally viewed and interpreted this ECG.   Date: 03/19/2023  EKG Time: 0941  Rate: 70  Rhythm: sinus  Axis: nl  Intervals:none  ST&T Change:  nostemi    RADIOLOGY I independently reviewed and interpreted CT scan of the head and see no obvious bleeding or midline shift I also reviewed radiologist's formal read.   PROCEDURES:  Critical Care performed: No  Procedures   MEDICATIONS ORDERED IN ED: Medications - No data to display  IMPRESSION / MDM / ASSESSMENT AND PLAN / ED COURSE  I reviewed the triage vital signs and the nursing notes.                                Patient's presentation is most consistent with acute presentation with potential threat to life or bodily function.  Differential diagnosis includes, but is not limited to, seizures, myoclonic jerks, sleep paralysis,  CVA, head bleed, electrolyte disturbance, arrhythmia   The patient is on the cardiac monitor to evaluate for evidence of arrhythmia and/or significant heart rate changes.  MDM:    Patient currently being worked up for seizure-like activity, only happens in sleep, suspect myoclonic jerks, seems to be consistent with sleep paralysis.  Will check electrolytes, pregnancy, CT head, EKG.  She looks well no neurologic deficits currently doubt stroke.  I doubt neurologic emergency.  If workup above is unremarkable plan will be for discharge and close follow-up with neurologist, PMD, and will refer for sleep clinic study. I gave her typical seizure precautions.     FINAL CLINICAL IMPRESSION(S) / ED DIAGNOSES   Final diagnoses:  Seizure-like activity [facility])     Rx / DC Orders   ED Discharge Orders          Ordered    Ambulatory referral to Sleep Studies        03/19/23 0912             Note:  This document was prepared using Dragon voice recognition software and may include unintentional dictation errors.    Pilar Jarvis, MD 03/19/23 [phone]

## 2023-03-20 ENCOUNTER — Encounter: Payer: Self-pay | Admitting: Physician Assistant

## 2023-03-20 ENCOUNTER — Ambulatory Visit: Payer: 59 | Admitting: Physician Assistant

## 2023-03-20 VITALS — BP 150/80 | HR 96 | Resp 16 | Ht 69.0 in | Wt 204.7 lb

## 2023-03-20 DIAGNOSIS — G4720 Circadian rhythm sleep disorder, unspecified type: Secondary | ICD-10-CM | POA: Diagnosis not present

## 2023-03-20 DIAGNOSIS — R251 Tremor, unspecified: Secondary | ICD-10-CM | POA: Diagnosis not present

## 2023-03-20 DIAGNOSIS — M62838 Other muscle spasm: Secondary | ICD-10-CM | POA: Diagnosis not present

## 2023-03-20 NOTE — Progress Notes (Signed)
Established Patient Office Visit  Name: Paula Page   MRN: [id number]    DOB: [birth year]   Date:03/21/2023  Today's Provider: Jacquelin Hawking, MHS, PA-C Introduced myself to the patient as a PA-C and provided education on APPs in clinical practice.         Subjective  Chief Complaint  Chief Complaint  Patient presents with   Spasms    While sleeping, recent episode on 03/07/23 and yesterday morning    HPI  Spasms  Reviewed neurology notes from 03/10/23 along with ED visit from 03/19/23  She has been scheduled for EEG in the next few months  She reports she is having jerking movements while sleeping along with spasms (she denies urinary incontinence during episodes)  She states she has muscle aches the next day and feels thirsty, has headaches as well  She reports she is also having increased fatigue even after sleeping through the night She reports problems with memory   She reports this has been intermittent and recurrent for several years Thinks the last time she had the spasms and shaking was about 2 years ago - she has not been worked up for seizures in the past  She is concerned about her low Ca on her labs      Patient Active Problem List   Diagnosis Date Noted   Muscle spasm 03/21/2023   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   Rupture of ligament of right ankle 07/23/2021   Pre-op examination 07/23/2021   Asymptomatic microscopic hematuria 07/23/2021   Memory loss 08/06/2019    Past Surgical History:  Procedure Laterality Date   ANKLE RECONSTRUCTION Right 07/26/2021   Procedure: RECONSTRUCTION ANKLE;  Surgeon: Rosetta Posner, DPM;  Location: [facility];  Service: Podiatry;  Laterality: Right;  Pop stafphblock with experal   CHOLECYSTECTOMY  09/23/13   IRRIGATION AND DEBRIDEMENT SEBACEOUS CYST     WOUND DEBRIDEMENT Right 07/26/2021   Procedure: A-SCOPE/DEBRIDEMENT; EXTENSIVE;  Surgeon: Rosetta Posner, DPM;   Location: [facility];  Service: Podiatry;  Laterality: Right;    Family History  Problem Relation Age of Onset   Stroke Mother    Hypertension Mother    Hyperlipidemia Father    Diabetes Paternal Uncle    Diabetes Paternal Grandmother    Cancer Paternal Grandfather 24       stomach cancer   Alcohol abuse Paternal Grandfather     Social History   Tobacco Use   Smoking status: Never   Smokeless tobacco: Never  Substance Use Topics   Alcohol use: Yes    Comment: occas     Current Outpatient Medications:    ASHWAGANDHA PO, Take by mouth at bedtime., Disp: , Rfl:    co-enzyme Q-10 30 MG capsule, Take 30 mg by mouth 3 (three) times daily., Disp: , Rfl:   Allergies  Allergen Reactions   Diclegis [Doxylamine-Pyridoxine] Hives   Penicillins Hives   Sulfa Antibiotics Hives   Hydrocodone Rash and Hives    I personally reviewed active problem list, medication list, notes from last encounter, lab results with the patient/caregiver today.   Review of Systems  Constitutional:  Positive for malaise/fatigue.  Musculoskeletal:  Positive for myalgias. Negative for falls.  Neurological:  Positive for seizures (seizure-like activity while sleeping), weakness and headaches. Negative for tingling and loss of consciousness.  Psychiatric/Behavioral:  Positive for memory loss.       Objective  Vitals:  03/20/23 1419  BP: (!) 150/80  Pulse: 96  Resp: 16  SpO2: 100%  Weight: 204 lb 11.2 oz (92.9 kg)  Height: 5\' 9"  (1.753 m)    Body mass index is 30.23 kg/m.  Physical Exam Vitals reviewed.  Constitutional:      General: She is awake.     Appearance: Normal appearance. She is well-developed and well-groomed.  HENT:     Head: Normocephalic and atraumatic.  Pulmonary:     Effort: Pulmonary effort is normal.  Musculoskeletal:     Cervical back: Normal range of motion.  Neurological:     General: No focal deficit present.     Mental Status: She is alert and  oriented to person, place, and time.     GCS: GCS eye subscore is 4. GCS verbal subscore is 5. GCS motor subscore is 6.     Cranial Nerves: No cranial nerve deficit, dysarthria or facial asymmetry.     Motor: No tremor or atrophy.     Gait: Gait is intact.  Psychiatric:        Mood and Affect: Mood normal.        Behavior: Behavior normal. Behavior is cooperative.        Thought Content: Thought content normal.        Judgment: Judgment normal.      Recent Results (from the past 2160 hour(s))  POCT HgB A1C     Status: Abnormal   Collection Time: 01/22/23  8:58 AM  Result Value Ref Range   Hemoglobin A1C 6.1 (A) 4.0 - 5.6 %   HbA1c POC (<> result, manual entry)     HbA1c, POC (prediabetic range)     HbA1c, POC (controlled diabetic range)    CBC with Differential     Status: None   Collection Time: 03/19/23  7:41 AM  Result Value Ref Range   WBC 6.9 4.0 - 10.5 K/uL   RBC 4.44 3.87 - 5.11 MIL/uL   Hemoglobin 12.2 12.0 - 15.0 g/dL   HCT 16.1 09.6 - 04.5 %   MCV 84.7 80.0 - 100.0 fL   MCH 27.5 26.0 - 34.0 pg   MCHC 32.4 30.0 - 36.0 g/dL   RDW 40.9 81.1 - 91.4 %   Platelets 306 150 - 400 K/uL   nRBC 0.0 0.0 - 0.2 %   Neutrophils Relative % 57 %   Neutro Abs 4.0 1.7 - 7.7 K/uL   Lymphocytes Relative 34 %   Lymphs Abs 2.4 0.7 - 4.0 K/uL   Monocytes Relative 8 %   Monocytes Absolute 0.5 0.1 - 1.0 K/uL   Eosinophils Relative 1 %   Eosinophils Absolute 0.0 0.0 - 0.5 K/uL   Basophils Relative 0 %   Basophils Absolute 0.0 0.0 - 0.1 K/uL   Immature Granulocytes 0 %   Abs Immature Granulocytes 0.01 0.00 - 0.07 K/uL    Comment: Performed at [facility], [address] [place], [place] [address]  Comprehensive metabolic panel     Status: Abnormal   Collection Time: 03/19/23  7:41 AM  Result Value Ref Range   Sodium 138 135 - 145 mmol/L   Potassium 3.6 3.5 - 5.1 mmol/L   Chloride 105 98 - 111 mmol/L   CO2 25 22 - 32 mmol/L   Glucose, Bld 115 (H) 70 - 99 mg/dL     Comment: Glucose reference range applies only to samples taken after fasting for at least 8 hours.   BUN 11 6 -  20 mg/dL   Creatinine, Ser 8.29 0.44 - 1.00 mg/dL   Calcium 8.5 (L) 8.9 - 10.3 mg/dL   Total Protein 7.0 6.5 - 8.1 g/dL   Albumin 3.6 3.5 - 5.0 g/dL   AST 17 15 - 41 U/L   ALT 16 0 - 44 U/L   Alkaline Phosphatase 59 38 - 126 U/L   Total Bilirubin 0.6 <1.2 mg/dL   GFR, Estimated >56 >21 mL/min    Comment: (NOTE) Calculated using the CKD-EPI Creatinine Equation (2021)    Anion gap 8 5 - 15    Comment: Performed at [facility], [address], [place], [place] [address]  Magnesium     Status: None   Collection Time: 03/19/23  7:42 AM  Result Value Ref Range   Magnesium 2.0 1.7 - 2.4 mg/dL    Comment: Performed at [facility], [address] [place], [place] [address]  POC CBG, ED     Status: Abnormal   Collection Time: 03/19/23  7:43 AM  Result Value Ref Range   Glucose-Capillary 115 (H) 70 - 99 mg/dL    Comment: Glucose reference range applies only to samples taken after fasting for at least 8 hours.  Urinalysis, Routine w reflex microscopic -Urine, Clean Catch     Status: Abnormal   Collection Time: 03/19/23  9:05 AM  Result Value Ref Range   Color, Urine YELLOW (A) YELLOW   APPearance CLEAR (A) CLEAR   Specific Gravity, Urine 1.017 1.005 - 1.030   pH 5.0 5.0 - 8.0   Glucose, UA NEGATIVE NEGATIVE mg/dL   Hgb urine dipstick MODERATE (A) NEGATIVE   Bilirubin Urine NEGATIVE NEGATIVE   Ketones, ur NEGATIVE NEGATIVE mg/dL   Protein, ur NEGATIVE NEGATIVE mg/dL   Nitrite NEGATIVE NEGATIVE   Leukocytes,Ua NEGATIVE NEGATIVE   RBC / HPF 0-5 0 - 5 RBC/hpf   WBC, UA 0-5 0 - 5 WBC/hpf   Bacteria, UA RARE (A) NONE SEEN   Squamous Epithelial / HPF 0-5 0 - 5 /HPF   Mucus PRESENT     Comment: Performed at [facility], [address] [place], [place] [address]  POC urine preg, ED     Status: None   Collection Time: 03/19/23  9:07 AM  Result  Value Ref Range   Preg Test, Ur Negative Negative  B12     Status: None   Collection Time: 03/20/23  3:18 PM  Result Value Ref Range   Vitamin B-12 581 200 - 1,100 pg/mL  Vitamin D (25 hydroxy)     Status: Abnormal   Collection Time: 03/20/23  3:18 PM  Result Value Ref Range   Vit D, 25-Hydroxy 20 (L) 30 - 100 ng/mL    Comment: Vitamin D Status         25-OH Vitamin D: . Deficiency:                    <20 ng/mL Insufficiency:             20 - 29 ng/mL Optimal:                 > or = 30 ng/mL . For 25-OH Vitamin D testing on patients on  D2-supplementation and patients for whom quantitation  of D2 and D3 fractions is required, the QuestAssureD(TM) 25-OH VIT D, (D2,D3), LC/MS/MS is recommended: order  code 84132 (patients >78yrs). . See Note 1 . Note 1 . For additional information, please refer to  http://education.QuestDiagnostics.com/faq/FAQ199  (This link is being provided for informational/ educational purposes only.)   CBC w/Diff/Platelet     Status: None   Collection Time: 03/20/23  3:18 PM  Result Value Ref Range   WBC 7.5 3.8 - 10.8 Thousand/uL   RBC 4.58 3.80 - 5.10 Million/uL   Hemoglobin 12.5 11.7 - 15.5 g/dL   HCT 47.8 29.5 - 62.1 %   MCV 84.3 80.0 - 100.0 fL   MCH 27.3 27.0 - 33.0 pg   MCHC 32.4 32.0 - 36.0 g/dL    Comment: For adults, a slight decrease in the calculated MCHC value (in the range of 30 to 32 g/dL) is most likely not clinically significant; however, it should be interpreted with caution in correlation with other red cell parameters and the patient's clinical condition.    RDW 13.7 11.0 - 15.0 %   Platelets 351 140 - 400 Thousand/uL   MPV 9.5 7.5 - 12.5 fL   Neutro Abs 4,140 1,500 - 7,800 cells/uL   Absolute Lymphocytes 2,610 850 - 3,900 cells/uL   Absolute Monocytes 683 200 - 950 cells/uL   Eosinophils Absolute 38 15 - 500 cells/uL   Basophils Absolute 30 0 - 200 cells/uL   Neutrophils Relative % 55.2 %   Total Lymphocyte 34.8 %    Monocytes Relative 9.1 %   Eosinophils Relative 0.5 %   Basophils Relative 0.4 %  Magnesium     Status: None   Collection Time: 03/20/23  3:18 PM  Result Value Ref Range   Magnesium 1.7 1.5 - 2.5 mg/dL     HYQ6/5:    78/08/6960    2:19 PM 01/22/2023    8:55 AM 10/18/2022    1:10 PM  Depression screen PHQ 2/9  Decreased Interest 0 0 1  Down, Depressed, Hopeless 0  1  PHQ - 2 Score 0 0 2  Altered sleeping 0 0 1  Tired, decreased energy 0 0 3  Change in appetite 0 0 0  Feeling bad or failure about yourself  0 0 0  Trouble concentrating 0 0 2  Moving slowly or fidgety/restless 0 0 0  Suicidal thoughts 0 0 0  PHQ-9 Score 0 0 8  Difficult doing work/chores Not difficult at all Not difficult at all Not difficult at all      Fall Risk:    03/20/2023    2:19 PM 01/22/2023    8:55 AM 10/18/2022    1:10 PM  Fall Risk   Falls in the past year? 0 0 0  Number falls in past yr: 0 0 0  Injury with Fall? 0 0 0  Risk for fall due to : No Fall Risks    Follow up Falls prevention discussed;Education provided;Falls evaluation completed        Functional Status Survey: Is the patient deaf or have difficulty hearing?: No Does the patient have difficulty seeing, even when wearing glasses/contacts?: No Does the patient have difficulty concentrating, remembering, or making decisions?: No Does the patient have difficulty walking or climbing stairs?: No Does the patient have difficulty dressing or bathing?: No Does the patient have difficulty doing errands alone such as visiting a doctor's office or shopping?: No    Assessment & Plan  Problem List Items Addressed This Visit       Other   Muscle spasm - Primary    Unsure of chronicity, appears to be recurrent Patient reports having episodes of muscle spasms, shaking while she is asleep along with fatigue  during the day, brain fog, short-term memory issues. I have reviewed her ED visit note from 03/19/23 and her Neurology notes from  03/10/23 along with lab results, imaging and proposed evaluation plans She has been scheduled for EEG and referral has been placed for sleep med  Reviewed that we can check B12 and Magnesium levels, CBC, and Vitamin D for further rule out  Recommend proceeding with the plans from neurology for now as they appear to be evaluating for seizure-like activity Reviewed that these tests and referral to Sleep med can provide further information with goal of providing dx and thus appropriate management Follow up in 3 months or sooner if concerns arise        Relevant Orders   B12 (Completed)   Vitamin D (25 hydroxy) (Completed)   CBC w/Diff/Platelet (Completed)   Magnesium (Completed)   Disturbed sleep rhythm   Other Visit Diagnoses     Episode of shaking       Relevant Orders   B12 (Completed)   Magnesium (Completed)      I spent approx 45 minutes with this patient discussing her concerns, providing education regarding upcoming testing and potential management, labs and chart review.   No follow-ups on file.   I, Jeptha Hinnenkamp E Hannah Strader, PA-C, have reviewed all documentation for this visit. The documentation on 03/21/23 for the exam, diagnosis, procedures, and orders are all accurate and complete.   Jacquelin Hawking, MHS, PA-C [facility]

## 2023-03-21 DIAGNOSIS — G4720 Circadian rhythm sleep disorder, unspecified type: Secondary | ICD-10-CM | POA: Insufficient documentation

## 2023-03-21 DIAGNOSIS — M62838 Other muscle spasm: Secondary | ICD-10-CM | POA: Insufficient documentation

## 2023-03-21 LAB — CBC WITH DIFFERENTIAL/PLATELET
Absolute Lymphocytes: 2610 {cells}/uL (ref 850–3900)
Absolute Monocytes: 683 {cells}/uL (ref 200–950)
Basophils Absolute: 30 {cells}/uL (ref 0–200)
Basophils Relative: 0.4 %
Eosinophils Absolute: 38 {cells}/uL (ref 15–500)
Eosinophils Relative: 0.5 %
HCT: 38.6 % (ref 35.0–45.0)
Hemoglobin: 12.5 g/dL (ref 11.7–15.5)
MCH: 27.3 pg (ref 27.0–33.0)
MCHC: 32.4 g/dL (ref 32.0–36.0)
MCV: 84.3 fL (ref 80.0–100.0)
MPV: 9.5 fL (ref 7.5–12.5)
Monocytes Relative: 9.1 %
Neutro Abs: 4140 {cells}/uL (ref 1500–7800)
Neutrophils Relative %: 55.2 %
Platelets: 351 10*3/uL (ref 140–400)
RBC: 4.58 10*6/uL (ref 3.80–5.10)
RDW: 13.7 % (ref 11.0–15.0)
Total Lymphocyte: 34.8 %
WBC: 7.5 10*3/uL (ref 3.8–10.8)

## 2023-03-21 LAB — VITAMIN B12: Vitamin B-12: 581 pg/mL (ref 200–1100)

## 2023-03-21 LAB — MAGNESIUM: Magnesium: 1.7 mg/dL (ref 1.5–2.5)

## 2023-03-21 LAB — VITAMIN D 25 HYDROXY (VIT D DEFICIENCY, FRACTURES): Vit D, 25-Hydroxy: 20 ng/mL — ABNORMAL LOW (ref 30–100)

## 2023-03-21 NOTE — Progress Notes (Signed)
Your labs are back Your magnesium was in normal range but if desired you can try supplementing this to assist with sleep. Your CBC was normal- no signs of anemia Your B12 was in normal range Your Vitamin D was low- I recommend supplementing this to help correct it. I have sent in a script for once weekly Vitamin D for you to start. We can recheck this in 3 months to make sure it is improving.

## 2023-03-21 NOTE — Assessment & Plan Note (Addendum)
Unsure of chronicity, appears to be recurrent Patient reports having episodes of muscle spasms, shaking while she is asleep along with fatigue during the day, brain fog, short-term memory issues. I have reviewed her ED visit note from 03/19/23 and her Neurology notes from 03/10/23 along with lab results, imaging and proposed evaluation plans She has been scheduled for EEG and referral has been placed for sleep med  Reviewed that we can check B12 and Magnesium levels, CBC, and Vitamin D for further rule out  Recommend proceeding with the plans from neurology for now as they appear to be evaluating for seizure-like activity Reviewed that these tests and referral to Sleep med can provide further information with goal of providing dx and thus appropriate management Follow up in 3 months or sooner if concerns arise

## 2023-03-28 ENCOUNTER — Telehealth: Payer: Self-pay | Admitting: Internal Medicine

## 2023-03-28 ENCOUNTER — Other Ambulatory Visit: Payer: Self-pay | Admitting: Physician Assistant

## 2023-03-28 DIAGNOSIS — E559 Vitamin D deficiency, unspecified: Secondary | ICD-10-CM

## 2023-03-28 MED ORDER — VITAMIN D (ERGOCALCIFEROL) 1.25 MG (50000 UNIT) PO CAPS: 50000.0000 [IU] | ORAL_CAPSULE | ORAL | 0 refills | Status: DC

## 2023-03-28 NOTE — Telephone Encounter (Signed)
Patient has viewed labs and request Vit D Rx be sent to pharmacy

## 2023-03-28 NOTE — Telephone Encounter (Signed)
Medication Refill -  Most Recent Primary Care Visit:  Provider: Jacquelin Hawking E  Department: [facility]  Visit Type: OFFICE VISIT  Date: 03/20/2023  Medication: Vitamin D not called in   Has the patient contacted their pharmacy? Yes (  Is this the correct pharmacy for this prescription? Yes   This is the patient's preferred pharmacy:  [id number] [place], [place] - [address] AT [facility] [address] [place] [address] Phone: [phone] Fax: [phone]   Has the prescription been filled recently? No  Is the patient out of the medication? Yes  Has the patient been seen for an appointment in the last year OR does the patient have an upcoming appointment? Yes  Can we respond through MyChart? Yes   Your labs are back Your magnesium was in normal range but if desired you can try supplementing this to assist with sleep. Your CBC was normal- no signs of anemia Your B12 was in normal range Your Vitamin D was low- I recommend supplementing this to help correct it. I have sent in a script for once weekly Vitamin D for you to start. We can recheck this in 3 months to make sure it is improving.  Written by Providence [place], PA-C on 03/21/2023 12:51 PM EST Seen by patient Nunzio Cobbs on 03/21/2023  1:03 PM

## 2023-03-31 ENCOUNTER — Other Ambulatory Visit: Payer: Self-pay | Admitting: Obstetrics and Gynecology

## 2023-03-31 DIAGNOSIS — Z1231 Encounter for screening mammogram for malignant neoplasm of breast: Secondary | ICD-10-CM

## 2023-03-31 LAB — HM PAP SMEAR: HM Pap smear: NEGATIVE

## 2023-06-30 ENCOUNTER — Other Ambulatory Visit: Payer: Self-pay | Admitting: Physician Assistant

## 2023-06-30 DIAGNOSIS — E559 Vitamin D deficiency, unspecified: Secondary | ICD-10-CM

## 2023-07-01 NOTE — Telephone Encounter (Signed)
Requested medications are due for refill today.  yes  Requested medications are on the active medications list.  yes  Last refill. 03/28/2023 #12 0 rf  Future visit scheduled.   yes  Notes to clinic.  Provider to review for this dosage.    Requested Prescriptions  Pending Prescriptions Disp Refills   Vitamin D, Ergocalciferol, (DRISDOL) 1.25 MG (50000 UNIT) CAPS capsule [Pharmacy Med Name: VITAMIN D2 50,000IU (ERGO) CAP RX] 12 capsule 0    Sig: TAKE 1 CAPSULE BY MOUTH EVERY 7 DAYS     Endocrinology:  Vitamins - Vitamin D Supplementation 2 Failed - 07/01/2023  2:42 PM      Failed - Manual Review: Route requests for 50,000 IU strength to the provider      Failed - Ca in normal range and within 360 days    Calcium  Date Value Ref Range Status  03/19/2023 8.5 (L) 8.9 - 10.3 mg/dL Final   Calcium, Total  Date Value Ref Range Status  09/03/2013 8.8 8.5 - 10.1 mg/dL Final         Failed - Vitamin D in normal range and within 360 days    Vit D, 25-Hydroxy  Date Value Ref Range Status  03/20/2023 20 (L) 30 - 100 ng/mL Final    Comment:    Vitamin D Status         25-OH Vitamin D: . Deficiency:                    <20 ng/mL Insufficiency:             20 - 29 ng/mL Optimal:                 > or = 30 ng/mL . For 25-OH Vitamin D testing on patients on  D2-supplementation and patients for whom quantitation  of D2 and D3 fractions is required, the QuestAssureD(TM) 25-OH VIT D, (D2,D3), LC/MS/MS is recommended: order  code 40981 (patients >52yrs). . See Note 1 . Note 1 . For additional information, please refer to  http://education.QuestDiagnostics.com/faq/FAQ199  (This link is being provided for informational/ educational purposes only.)          Passed - Valid encounter within last 12 months    Recent Outpatient Visits           3 months ago Muscle spasm   [facility] Mecum, Oswaldo Conroy, PA-C   5 months ago Prediabetes   [facility] Margarita Mail, DO   8 months ago Right flank pain   [facility] Margarita Mail, DO       Future Appointments             In 4 weeks Margarita Mail, DO [facility]

## 2023-07-29 ENCOUNTER — Other Ambulatory Visit: Payer: Self-pay

## 2023-07-29 ENCOUNTER — Encounter: Payer: Self-pay | Admitting: Internal Medicine

## 2023-07-29 ENCOUNTER — Ambulatory Visit: Payer: 59 | Admitting: Internal Medicine

## 2023-07-29 VITALS — BP 122/80 | HR 97 | Temp 98.2°F | Resp 16 | Ht 69.0 in | Wt 198.2 lb

## 2023-07-29 DIAGNOSIS — Z1159 Encounter for screening for other viral diseases: Secondary | ICD-10-CM

## 2023-07-29 DIAGNOSIS — R7303 Prediabetes: Secondary | ICD-10-CM | POA: Insufficient documentation

## 2023-07-29 DIAGNOSIS — E782 Mixed hyperlipidemia: Secondary | ICD-10-CM | POA: Diagnosis not present

## 2023-07-29 DIAGNOSIS — E559 Vitamin D deficiency, unspecified: Secondary | ICD-10-CM | POA: Diagnosis not present

## 2023-07-29 NOTE — Progress Notes (Signed)
 Established Patient Office Visit  Subjective    Patient ID: Paula Page, female    DOB: 1981-03-04  Age: 43 y.o. MRN: [id number]  CC:  Chief Complaint  Patient presents with   Medical Management of Chronic Issues    6 month recheck    HPI Paula Page presents to follow up on chronic medical conditions.   Pre-Diabetes: -A1c 6.1% 9/24 -Not currently on medications -Has a strong family history of DM  HLD: -Medications: Nothing -Last lipid panel: Lipid Panel     Component Value Date/Time   CHOL 211 (H) 10/18/2022 1356   TRIG 122 10/18/2022 1356   HDL 59 10/18/2022 1356   CHOLHDL 3.6 10/18/2022 1356   LDLCALC 129 (H) 10/18/2022 1356    The 10-year ASCVD risk score (Arnett DK, et al., 2019) is: 0.5%   Values used to calculate the score:     Age: 82 years     Sex: Female     Is Non-Hispanic African American: Yes     Diabetic: No     Tobacco smoker: No     Systolic Blood Pressure: 122 mmHg     Is BP treated: No     HDL Cholesterol: 59 mg/dL     Total Cholesterol: 211 mg/dL  Vitamin D Deficiency:  -Last level 11/24 20 -She was on 50,000 international units weekly for 12 weeks, now on a daily OTC supplement but is uncertain of the dose  Health Maintenance: -Blood work due -Mammogram 7/24 Birads-1 -Pap 11/24 negative - follows with Gynecology   Outpatient Encounter Medications as of 07/29/2023  Medication Sig   ASHWAGANDHA PO Take by mouth at bedtime.   co-enzyme Q-10 30 MG capsule Take 30 mg by mouth 3 (three) times daily.   Vitamin D, Ergocalciferol, (DRISDOL) 1.25 MG (50000 UNIT) CAPS capsule Take 1 capsule (50,000 Units total) by mouth every 7 (seven) days. (Patient not taking: Reported on 07/29/2023)   No facility-administered encounter medications on file as of 07/29/2023.    Past Medical History:  Diagnosis Date   Abnormal uterine bleeding (AUB) 09/28/2018   GERD (gastroesophageal reflux disease)    during pregnancy and before chole   Wisdom teeth  extracted     Past Surgical History:  Procedure Laterality Date   ANKLE RECONSTRUCTION Right 07/26/2021   Procedure: RECONSTRUCTION ANKLE;  Surgeon: Rosetta Posner, DPM;  Location: [facility];  Service: Podiatry;  Laterality: Right;  Pop stafphblock with experal   CHOLECYSTECTOMY  09/23/13   IRRIGATION AND DEBRIDEMENT SEBACEOUS CYST     WOUND DEBRIDEMENT Right 07/26/2021   Procedure: A-SCOPE/DEBRIDEMENT; EXTENSIVE;  Surgeon: Rosetta Posner, DPM;  Location: [facility];  Service: Podiatry;  Laterality: Right;    Family History  Problem Relation Age of Onset   Stroke Mother    Hypertension Mother    Hyperlipidemia Father    Diabetes Paternal Uncle    Diabetes Paternal Grandmother    Cancer Paternal Grandfather 48       stomach cancer   Alcohol abuse Paternal Grandfather     Social History   Socioeconomic History   Marital status: Single    Spouse name: Not on file   Number of children: 3   Years of education: Not on file   Highest education level: Not on file  Occupational History   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    Comment: [employer] [place]  Tobacco Use   Smoking status: Never   Smokeless tobacco: Never  Advertising account planner  Vaping status: Never Used  Substance and Sexual Activity   Alcohol use: Yes    Comment: occas   Drug use: No   Sexual activity: Yes  Other Topics Concern   Not on file  Social History Narrative   Single   3 children   2 different fathers --both involved in care   Social Drivers of Health   Financial Resource Strain: Low Risk  (03/31/2023)   Received from [facility]   Overall Financial Resource Strain (CARDIA)    Difficulty of Paying Living Expenses: Not very hard  Food Insecurity: No Food Insecurity (03/31/2023)   Received from [facility]   Hunger Vital Sign    Worried About Running Out of Food in the Last Year: Never true    Ran Out of Food in the Last Year: Never true  Transportation Needs:  No Transportation Needs (03/31/2023)   Received from [facility] - Transportation    In the past 12 months, has lack of transportation kept you from medical appointments or from getting medications?: No    Lack of Transportation (Non-Medical): No  Physical Activity: Not on file  Stress: Not on file  Social Connections: Not on file  Intimate Partner Violence: Not on file    Review of Systems  All other systems reviewed and are negative.       Objective    BP 122/80 (Cuff Size: Large)   Pulse 97   Temp 98.2 F (36.8 C) (Oral)   Resp 16   Ht 5\' 9"  (1.753 m)   Wt 198 lb 3.2 oz (89.9 kg)   LMP 07/16/2023   SpO2 98%   BMI 29.27 kg/m   Physical Exam Constitutional:      Appearance: Normal appearance.  HENT:     Head: Normocephalic and atraumatic.     Mouth/Throat:     Mouth: Mucous membranes are moist.     Pharynx: Oropharynx is clear.  Eyes:     Extraocular Movements: Extraocular movements intact.     Conjunctiva/sclera: Conjunctivae normal.     Pupils: Pupils are equal, round, and reactive to light.  Neck:     Comments: No thyromegaly Cardiovascular:     Rate and Rhythm: Normal rate and regular rhythm.  Pulmonary:     Effort: Pulmonary effort is normal.     Breath sounds: Normal breath sounds.  Musculoskeletal:     Cervical back: No tenderness.     Right lower leg: No edema.     Left lower leg: No edema.  Lymphadenopathy:     Cervical: No cervical adenopathy.  Skin:    General: Skin is warm and dry.  Neurological:     General: No focal deficit present.     Mental Status: She is alert. Mental status is at baseline.  Psychiatric:        Mood and Affect: Mood normal.        Behavior: Behavior normal.         Assessment & Plan:   Prediabetes Assessment & Plan: A1c due today, not on medications.  Orders: -     Hemoglobin A1c  Moderate mixed hyperlipidemia not requiring statin therapy Assessment & Plan: Recheck fasting  labs.  Orders: -     Lipid panel  Vitamin D deficiency Assessment & Plan: Recheck levels, she is uncertain what her current dose is.   Orders: -     VITAMIN D 25 Hydroxy (Vit-D Deficiency, Fractures)  Need for hepatitis C screening test -     Hepatitis C antibody     Return in about 6 months (around 01/29/2024).   Margarita Mail, DO

## 2023-07-29 NOTE — Assessment & Plan Note (Signed)
 A1c due today, not on medications.

## 2023-07-29 NOTE — Assessment & Plan Note (Signed)
 Recheck levels, she is uncertain what her current dose is.

## 2023-07-29 NOTE — Assessment & Plan Note (Signed)
Recheck fasting labs.  

## 2023-07-30 LAB — HEMOGLOBIN A1C
Hgb A1c MFr Bld: 6.2 %{Hb} — ABNORMAL HIGH (ref <5.7)
Mean Plasma Glucose: 131 mg/dL
eAG (mmol/L): 7.3 mmol/L

## 2023-07-30 LAB — LIPID PANEL
Cholesterol: 198 mg/dL (ref <200)
HDL: 54 mg/dL (ref >=50)
LDL Cholesterol (Calc): 124 mg/dL — ABNORMAL HIGH
Non-HDL Cholesterol (Calc): 144 mg/dL — ABNORMAL HIGH (ref <130)
Total CHOL/HDL Ratio: 3.7 (calc) (ref <5.0)
Triglycerides: 96 mg/dL (ref <150)

## 2023-07-30 LAB — VITAMIN D 25 HYDROXY (VIT D DEFICIENCY, FRACTURES): Vit D, 25-Hydroxy: 35 ng/mL (ref 30–100)

## 2023-07-30 LAB — HEPATITIS C ANTIBODY: Hepatitis C Ab: NONREACTIVE

## 2023-08-01 ENCOUNTER — Encounter: Payer: Self-pay | Admitting: Internal Medicine

## 2023-08-08 ENCOUNTER — Other Ambulatory Visit: Payer: Self-pay | Admitting: Internal Medicine

## 2023-08-08 DIAGNOSIS — E559 Vitamin D deficiency, unspecified: Secondary | ICD-10-CM

## 2023-08-08 NOTE — Telephone Encounter (Signed)
 Copied from CRM [id number]. Topic: Clinical - Prescription Issue >> Aug 08, 2023  1:57 PM Carlatta H wrote: Reason for CRM: Patient is still taking the Vitamin D, Ergocalciferol, (DRISDOL) 1.25 MG (50000 UNIT) CAPS capsule [[id number]] but the prescription is not being filled and listed as not taking//Please call patient to advised

## 2023-08-08 NOTE — Telephone Encounter (Signed)
 Per last lab note you were going to refill

## 2023-08-08 NOTE — Telephone Encounter (Signed)
 Copied from CRM [id number]. Topic: Clinical - Medication Refill >> Aug 08, 2023  1:55 PM Carlatta H wrote: Most Recent Primary Care Visit:  Provider: Margarita Mail  Department: [facility]  Visit Type: OFFICE VISIT  Date: 07/29/2023  Medication: Vitamin D, Ergocalciferol, (DRISDOL) 1.25 MG (50000 UNIT) CAPS capsule [[id number]]  Has the patient contacted their pharmacy? No (Agent: If no, request that the patient contact the pharmacy for the refill. If patient does not wish to contact the pharmacy document the reason why and proceed with request.) (Agent: If yes, when and what did the pharmacy advise?) Patient does not have refills left but she was seen on 07/29/23  Is this the correct pharmacy for this prescription? Yes If no, delete pharmacy and type the correct one.  This is the patient's preferred pharmacy:  [id number] [place], [place] - [address] AT [facility] [address] [place] [address] Phone: [phone] Fax: [phone]   Has the prescription been filled recently? No  Is the patient out of the medication? Yes  Has the patient been seen for an appointment in the last year OR does the patient have an upcoming appointment? Yes  Can we respond through MyChart? Yes  Agent: Please be advised that Rx refills may take up to 3 business days. We ask that you follow-up with your pharmacy.

## 2023-08-12 MED ORDER — VITAMIN D (ERGOCALCIFEROL) 1.25 MG (50000 UNIT) PO CAPS
50000.0000 [IU] | ORAL_CAPSULE | ORAL | 0 refills | Status: DC
Start: 2023-08-12 — End: 2024-01-30

## 2023-11-15 ENCOUNTER — Emergency Department

## 2023-11-15 ENCOUNTER — Emergency Department
Admission: EM | Admit: 2023-11-15 | Discharge: 2023-11-15 | Disposition: A | Discharge status: Home / Self Care | Attending: Emergency Medicine | Admitting: Emergency Medicine

## 2023-11-15 ENCOUNTER — Other Ambulatory Visit: Payer: Self-pay

## 2023-11-15 DIAGNOSIS — R0789 Other chest pain: Secondary | ICD-10-CM | POA: Diagnosis present

## 2023-11-15 DIAGNOSIS — I493 Ventricular premature depolarization: Secondary | ICD-10-CM | POA: Diagnosis not present

## 2023-11-15 LAB — HEPATIC FUNCTION PANEL
ALT: 10 U/L (ref 0–44)
AST: 14 U/L — ABNORMAL LOW (ref 15–41)
Albumin: 3.8 g/dL (ref 3.5–5.0)
Alkaline Phosphatase: 57 U/L (ref 38–126)
Bilirubin, Direct: <0.1 mg/dL (ref 0.0–0.2)
Total Bilirubin: 0.5 mg/dL (ref 0.0–1.2)
Total Protein: 7.2 g/dL (ref 6.5–8.1)

## 2023-11-15 LAB — CBC
HCT: 41 % (ref 36.0–46.0)
Hemoglobin: 13 g/dL (ref 12.0–15.0)
MCH: 26.9 pg (ref 26.0–34.0)
MCHC: 31.7 g/dL (ref 30.0–36.0)
MCV: 84.9 fL (ref 80.0–100.0)
Platelets: 285 K/uL (ref 150–400)
RBC: 4.83 MIL/uL (ref 3.87–5.11)
RDW: 14.4 % (ref 11.5–15.5)
WBC: 6.5 K/uL (ref 4.0–10.5)
nRBC: 0 % (ref 0.0–0.2)

## 2023-11-15 LAB — BASIC METABOLIC PANEL WITH GFR
Anion gap: 7 (ref 5–15)
BUN: 15 mg/dL (ref 6–20)
CO2: 26 mmol/L (ref 22–32)
Calcium: 8.8 mg/dL — ABNORMAL LOW (ref 8.9–10.3)
Chloride: 106 mmol/L (ref 98–111)
Creatinine, Ser: 0.9 mg/dL (ref 0.44–1.00)
GFR, Estimated: >60 mL/min (ref >60)
Glucose, Bld: 105 mg/dL — ABNORMAL HIGH (ref 70–99)
Potassium: 3.8 mmol/L (ref 3.5–5.1)
Sodium: 139 mmol/L (ref 135–145)

## 2023-11-15 LAB — LIPASE, BLOOD: Lipase: 29 U/L (ref 11–51)

## 2023-11-15 LAB — TSH: TSH: 2.161 u[IU]/mL (ref 0.350–4.500)

## 2023-11-15 LAB — T4, FREE: Free T4: 0.66 ng/dL (ref 0.61–1.12)

## 2023-11-15 LAB — TROPONIN I (HIGH SENSITIVITY)
Troponin I (High Sensitivity): <2 ng/L (ref <18)
Troponin I (High Sensitivity): 4 ng/L (ref <18)

## 2023-11-15 NOTE — Discharge Instructions (Signed)
 Your EKG shows that you have frequent PVCs which are premature ventricular contractions within the heart.  No signs of damage to your heart today.  Your thyroid  labs and other labs are all reassuring at this time.  I have sent you follow-up with a cardiologist as they may discuss wearing a monitor to track the rhythm of your heart.  Please return for any severe or worsening symptoms.

## 2023-11-15 NOTE — ED Triage Notes (Signed)
 Pt to ED POV for chest tightness that started last night, went away, and returned this morning around 10am.  Tightness is to R side. Radiates into R shoulder.  Pt has been feeling fatigued for about 1 month and started checking pulse 2 weeks ago with pulse oximeter, HR was in 30s several times. EKG shows sinus dysrhythmia with frequent PVCs.   Not dizzy or SOB at this time.

## 2023-11-15 NOTE — ED Provider Notes (Signed)
 [facility] Provider Note    Event Date/Time   First MD Initiated Contact with Patient 11/15/23 1114     (approximate)   History   Chest Pain   HPI Paula Page is a 43 y.o. female presenting today for chest pain.  Patient states for the past month she has been experiencing intermittent symptoms of fatigue.  Yesterday she had an episode with palpitations and feeling chest tightness.  They resolved after a brief episode but then reoccurred today around 10 AM.  She noted some generalized abdominal cramping but no specific pain in any 1 area.  Denies cough, congestion, body aches, shortness of breath, nausea, vomiting, diarrhea, constipation, dysuria, leg swelling.  States she was wearing a monitor at home that stated she was having frequent PVCs.  No history of the same.  No smoking history.     Physical Exam   Triage Vital Signs: ED Triage Vitals  Encounter Vitals Group     BP 11/15/23 1109 128/80     Girls Systolic BP Percentile --      Girls Diastolic BP Percentile --      Boys Systolic BP Percentile --      Boys Diastolic BP Percentile --      Pulse Rate 11/15/23 1109 67     Resp 11/15/23 1109 20     Temp 11/15/23 1113 98.4 F (36.9 C)     Temp Source 11/15/23 1109 Oral     SpO2 11/15/23 1109 100 %     Weight 11/15/23 1111 195 lb (88.5 kg)     Height 11/15/23 1111 5' 9 (1.753 m)     Head Circumference --      Peak Flow --      Pain Score 11/15/23 1108 6     Pain Loc --      Pain Education --      Exclude from Growth Chart --     Most recent vital signs: Vitals:   11/15/23 1109 11/15/23 1113  BP: 128/80   Pulse: 67   Resp: 20   Temp:  98.4 F (36.9 C)  SpO2: 100%    Physical Exam: I have reviewed the vital signs and nursing notes. General: Awake, alert, no acute distress.  Nontoxic appearing. Head:  Atraumatic, normocephalic.   ENT:  EOM intact, PERRL. Oral mucosa is pink and moist with no lesions. Neck: Neck is supple with full  range of motion, No meningeal signs. Cardiovascular:  RRR, No murmurs. Peripheral pulses palpable and equal bilaterally. Respiratory:  Symmetrical chest wall expansion.  No rhonchi, rales, or wheezes.  Good air movement throughout.  No use of accessory muscles.   Musculoskeletal:  No cyanosis or edema. Moving extremities with full ROM Abdomen:  Soft, nontender, nondistended. Neuro:  GCS 15, moving all four extremities, interacting appropriately. Speech clear. Psych:  Calm, appropriate.   Skin:  Warm, dry, no rash.    ED Results / Procedures / Treatments   Labs (all labs ordered are listed, but only abnormal results are displayed) Labs Reviewed  BASIC METABOLIC PANEL WITH GFR - Abnormal; Notable for the following components:      Result Value   Glucose, Bld 105 (*)    Calcium 8.8 (*)    All other components within normal limits  HEPATIC FUNCTION PANEL - Abnormal; Notable for the following components:   AST 14 (*)    All other components within normal limits  CBC  TSH  T4, FREE  LIPASE, BLOOD  POC URINE PREG, ED  TROPONIN I (HIGH SENSITIVITY)  TROPONIN I (HIGH SENSITIVITY)     EKG My EKG interpretation: Rate of 74, normal sinus rhythm with frequent PVCs.  Normal axis, normal intervals otherwise.  No acute ST elevations or depressions   RADIOLOGY Independently interpreted chest x-ray with no acute findings   PROCEDURES:  Critical Care performed: No  Procedures   MEDICATIONS ORDERED IN ED: Medications - No data to display   IMPRESSION / MDM / ASSESSMENT AND PLAN / ED COURSE  I reviewed the triage vital signs and the nursing notes.                              Differential diagnosis includes, but is not limited to, frequent PVCs, palpitations, other cardiac arrhythmia, electrolyte abnormality, hyperthyroidism, lower suspicion pneumonia or ACS.  Patient's presentation is most consistent with acute complicated illness / injury requiring diagnostic  workup.  Patient is a 43 year old female presenting today for 2 brief episodes of chest pain associate with palpitations.  Currently she is asymptomatic with stable vital signs.  Her EKG does show frequent PVCs.  I do not see an obvious history of the same on prior ones.  Physical exam otherwise unremarkable at this time.  Chest x-ray unremarkable.  Troponin negative x 2.  Otherwise CBC, BMP, hepatic function panel, lipase, thyroid  panel all reassuring.  Patient asymptomatic on recheck.  Suspect likely her frequent PVCs resulting in her symptoms here but she is otherwise stable with no additional concerning findings.  Will discharge her with cardiology follow-up and given strict return precautions.  The patient is on the cardiac monitor to evaluate for evidence of arrhythmia and/or significant heart rate changes.     FINAL CLINICAL IMPRESSION(S) / ED DIAGNOSES   Final diagnoses:  Frequent PVCs     Rx / DC Orders   ED Discharge Orders          Ordered    Ambulatory referral to Cardiology       Comments: If you have not heard from the Cardiology office within the next 72 hours please call [phone].   11/15/23 1437             Note:  This document was prepared using Dragon voice recognition software and may include unintentional dictation errors.   Malvina Alm DASEN, MD 11/15/23 [phone]

## 2024-01-30 ENCOUNTER — Other Ambulatory Visit: Payer: Self-pay

## 2024-01-30 ENCOUNTER — Ambulatory Visit: Attending: Internal Medicine

## 2024-01-30 ENCOUNTER — Ambulatory Visit: Admitting: Internal Medicine

## 2024-01-30 ENCOUNTER — Encounter: Payer: Self-pay | Admitting: Internal Medicine

## 2024-01-30 VITALS — BP 118/72 | HR 100 | Temp 98.1°F | Resp 16 | Ht 69.0 in | Wt 195.6 lb

## 2024-01-30 DIAGNOSIS — R7303 Prediabetes: Secondary | ICD-10-CM | POA: Diagnosis not present

## 2024-01-30 DIAGNOSIS — I493 Ventricular premature depolarization: Secondary | ICD-10-CM

## 2024-01-30 DIAGNOSIS — Z0000 Encounter for general adult medical examination without abnormal findings: Secondary | ICD-10-CM

## 2024-01-30 DIAGNOSIS — Z0001 Encounter for general adult medical examination with abnormal findings: Secondary | ICD-10-CM | POA: Diagnosis not present

## 2024-01-30 LAB — POCT GLYCOSYLATED HEMOGLOBIN (HGB A1C): Hemoglobin A1C: 5.7 % — AB (ref 4.0–5.6)

## 2024-01-30 NOTE — Progress Notes (Signed)
 Established Patient Office Visit  Subjective    Patient ID: Paula Page, female    DOB: 07-14-1980  Age: 43 y.o. MRN: [id number]  CC:  Chief Complaint  Patient presents with   Medical Management of Chronic Issues    6 month recheck    HPI TIMBER MARSHMAN presents to follow up on chronic medical conditions.   Discussed the use of AI scribe software for clinical note transcription with the patient, who gave verbal consent to proceed.  History of Present Illness Paula Page is a 43 year old female who presents with palpitations and episodes of low heart rate.  Since May, she experiences palpitations and episodes of low heart rate, with readings of 38 bpm at rest and 138-140 bpm during activity, confirmed by a watch and pulse oximeter. In July, she visited the ER twice and was diagnosed with PVCs. She experienced lightheadedness, nausea, and chest pain once after returning from the store, leading to her second ER visit. These symptoms have occurred twice since May. She continues to monitor her heart rate, noting low readings even when not sleeping. No recent episodes of chest pain, lightheadedness, or nausea since May.  She has an umbilical hernia, identified during an ER visit for abdominal pain, and associates her abdominal pain with gastrointestinal issues. She recalls a past incident of stool discoloration due to beet consumption.  She takes a calcium supplement with vitamin D . Her A1c improved from 6.2 in March to 5.7. She works a sedentary job and exercises at home on a treadmill. Family history includes a relative with a pacemaker due to PVCs.   Pre-Diabetes: -A1c 6.2% 3/25 -Not currently on medications -Has a strong family history of DM  HLD: -Medications: Nothing -Last lipid panel: Lipid Panel     Component Value Date/Time   CHOL 198 07/29/2023 0905   TRIG 96 07/29/2023 0905   HDL 54 07/29/2023 0905   CHOLHDL 3.7 07/29/2023 0905   LDLCALC 124 (H) 07/29/2023 0905     The 10-year ASCVD risk score (Arnett DK, et al., 2019) is: 0.6%   Values used to calculate the score:     Age: 40 years     Clincally relevant sex: Female     Is Non-Hispanic African American: Yes     Diabetic: No     Tobacco smoker: No     Systolic Blood Pressure: 118 mmHg     Is BP treated: No     HDL Cholesterol: 54 mg/dL     Total Cholesterol: 198 mg/dL  Health Maintenance: -Blood work UTD -Mammogram 7/24 Birads-1, ordered -Pap 11/24 negative - follows with Gynecology   Outpatient Encounter Medications as of 01/30/2024  Medication Sig   ASHWAGANDHA PO Take by mouth at bedtime.   co-enzyme Q-10 30 MG capsule Take 30 mg by mouth 3 (three) times daily.   Vitamin D , Ergocalciferol , (DRISDOL ) 1.25 MG (50000 UNIT) CAPS capsule Take 1 capsule (50,000 Units total) by mouth every 7 (seven) days. (Patient not taking: Reported on 01/30/2024)   No facility-administered encounter medications on file as of 01/30/2024.    Past Medical History:  Diagnosis Date   Abnormal uterine bleeding (AUB) 09/28/2018   GERD (gastroesophageal reflux disease)    during pregnancy and before chole   Wisdom teeth extracted     Past Surgical History:  Procedure Laterality Date   ANKLE RECONSTRUCTION Right 07/26/2021   Procedure: RECONSTRUCTION ANKLE;  Surgeon: Lennie Barter, DPM;  Location: [facility];  Service:  Podiatry;  Laterality: Right;  Pop stafphblock with experal   CHOLECYSTECTOMY  09/23/13   IRRIGATION AND DEBRIDEMENT SEBACEOUS CYST     WOUND DEBRIDEMENT Right 07/26/2021   Procedure: A-SCOPE/DEBRIDEMENT; EXTENSIVE;  Surgeon: Lennie Barter, DPM;  Location: [facility];  Service: Podiatry;  Laterality: Right;    Family History  Problem Relation Age of Onset   Stroke Mother    Hypertension Mother    Hyperlipidemia Father    Diabetes Paternal Uncle    Diabetes Paternal Grandmother    Cancer Paternal Grandfather 53       stomach cancer   Alcohol abuse Paternal Grandfather      Social History   Socioeconomic History   Marital status: Single    Spouse name: Not on file   Number of children: 3   Years of education: Not on file   Highest education level: Not on file  Occupational History   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    Comment: [employer] [place]  Tobacco Use   Smoking status: Never   Smokeless tobacco: Never  Vaping Use   Vaping status: Never Used  Substance and Sexual Activity   Alcohol use: Yes    Comment: occas   Drug use: No   Sexual activity: Yes  Other Topics Concern   Not on file  Social History Narrative   Single   3 children   2 different fathers --both involved in care   Social Drivers of Health   Financial Resource Strain: Low Risk  (03/31/2023)   Received from [facility]   Overall Financial Resource Strain (CARDIA)    Difficulty of Paying Living Expenses: Not very hard  Food Insecurity: No Food Insecurity (03/31/2023)   Received from [facility]   Hunger Vital Sign    Within the past 12 months, you worried that your food would run out before you got the money to buy more.: Never true    Within the past 12 months, the food you bought just didn't last and you didn't have money to get more.: Never true  Transportation Needs: No Transportation Needs (03/31/2023)   Received from [facility] - Transportation    In the past 12 months, has lack of transportation kept you from medical appointments or from getting medications?: No    Lack of Transportation (Non-Medical): No  Physical Activity: Not on file  Stress: Not on file  Social Connections: Not on file  Intimate Partner Violence: Not At Risk (11/30/2023)   Received from [facility]   Humiliation, Afraid, Rape, and Kick questionnaire    Within the last year, have you been afraid of your partner or ex-partner?: No    Within the last year, have you been humiliated or emotionally abused in other ways by your  partner or ex-partner?: No    Within the last year, have you been kicked, hit, slapped, or otherwise physically hurt by your partner or ex-partner?: No    Within the last year, have you been raped or forced to have any kind of sexual activity by your partner or ex-partner?: No    Review of Systems  Respiratory:  Negative for shortness of breath.   Cardiovascular:  Positive for palpitations. Negative for chest pain.  All other systems reviewed and are negative.       Objective    BP 118/72 (Cuff Size: Large)   Pulse 100   Temp 98.1 F (36.7 C) (Oral)   Resp  16   Ht 5' 9 (1.753 m)   Wt 195 lb 9.6 oz (88.7 kg)   SpO2 99%   BMI 28.89 kg/m   Physical Exam Constitutional:      Appearance: Normal appearance.  HENT:     Head: Normocephalic and atraumatic.  Eyes:     Conjunctiva/sclera: Conjunctivae normal.  Cardiovascular:     Rate and Rhythm: Normal rate and regular rhythm.  Pulmonary:     Effort: Pulmonary effort is normal.     Breath sounds: Normal breath sounds.  Skin:    General: Skin is warm and dry.  Neurological:     General: No focal deficit present.     Mental Status: She is alert. Mental status is at baseline.  Psychiatric:        Mood and Affect: Mood normal.        Behavior: Behavior normal.         Assessment & Plan:   Assessment & Plan Annual Wellness Blood work up to date. Mammogram ordered, will call to schedule. Declines flu vaccine.   Premature ventricular complexes (PVCs) Intermittent palpitations, chest pain, and variable heart rate. PVCs common but further evaluation needed to exclude other cardiac issues. - Order Zio patch heart monitor for two weeks. - Schedule follow-up in two months to review results. - Consider cardiology referral based on results.  Prediabetes A1c improved from 6.2% to 5.7%, nearing normal range. - Recheck A1c in spring.  - POCT HgB A1C - LONG TERM MONITOR (3-14 DAYS); Future    Return in about 2 months  (around 03/31/2024).   Sharyle Fischer, DO

## 2024-02-03 ENCOUNTER — Ambulatory Visit: Payer: Self-pay | Admitting: Family Medicine

## 2024-02-03 ENCOUNTER — Encounter: Payer: Self-pay | Admitting: Family Medicine

## 2024-02-03 ENCOUNTER — Ambulatory Visit: Payer: Self-pay

## 2024-02-03 DIAGNOSIS — M25552 Pain in left hip: Secondary | ICD-10-CM

## 2024-02-03 DIAGNOSIS — M25511 Pain in right shoulder: Secondary | ICD-10-CM

## 2024-02-03 DIAGNOSIS — M25551 Pain in right hip: Secondary | ICD-10-CM

## 2024-02-03 DIAGNOSIS — R519 Headache, unspecified: Secondary | ICD-10-CM

## 2024-02-03 MED ORDER — IBUPROFEN 400 MG PO TABS: 400.0000 mg | ORAL_TABLET | Freq: Four times a day (QID) | ORAL | 0 refills | Status: AC | PRN

## 2024-02-03 MED ORDER — CYCLOBENZAPRINE HCL 5 MG PO TABS: 5.0000 mg | ORAL_TABLET | Freq: Three times a day (TID) | ORAL | 1 refills | Status: DC | PRN
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
## 2024-03-07 ENCOUNTER — Other Ambulatory Visit: Payer: Self-pay | Admitting: Cardiology

## 2024-03-07 DIAGNOSIS — I493 Ventricular premature depolarization: Secondary | ICD-10-CM

## 2024-03-08 ENCOUNTER — Ambulatory Visit: Payer: Self-pay | Admitting: Internal Medicine

## 2024-04-01 ENCOUNTER — Ambulatory Visit: Admitting: Internal Medicine

## 2024-04-01 ENCOUNTER — Encounter: Payer: Self-pay | Admitting: Internal Medicine

## 2024-04-01 ENCOUNTER — Other Ambulatory Visit: Payer: Self-pay

## 2024-04-01 VITALS — BP 120/80 | HR 92 | Temp 98.8°F | Resp 16 | Ht 69.0 in | Wt 194.2 lb

## 2024-04-01 DIAGNOSIS — R002 Palpitations: Secondary | ICD-10-CM
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
## 2024-04-02 ENCOUNTER — Ambulatory Visit: Payer: Self-pay | Admitting: Internal Medicine

## 2024-04-02 LAB — BASIC METABOLIC PANEL WITH GFR
BUN: 12 mg/dL (ref 7–25)
CO2: 26 mmol/L (ref 20–32)
Calcium: 8.7 mg/dL (ref 8.6–10.2)
Chloride: 104 mmol/L (ref 98–110)
Creat: 0.89 mg/dL (ref 0.50–0.99)
Glucose, Bld: 77 mg/dL (ref 65–99)
Potassium: 4.6 mmol/L (ref 3.5–5.3)
Sodium: 138 mmol/L (ref 135–146)
eGFR: 82 mL/min/1.73m2 (ref >=60)

## 2024-04-02 LAB — MAGNESIUM: Magnesium: 1.9 mg/dL (ref 1.5–2.5)

## 2024-04-21 ENCOUNTER — Other Ambulatory Visit: Payer: Self-pay | Admitting: Internal Medicine

## 2024-04-21 DIAGNOSIS — E559 Vitamin D deficiency, unspecified: Secondary | ICD-10-CM

## 2024-04-23 ENCOUNTER — Encounter: Payer: Self-pay | Admitting: Student in an Organized Health Care Education/Training Program

## 2024-04-23 ENCOUNTER — Ambulatory Visit
Attending: Student in an Organized Health Care Education/Training Program | Admitting: Student in an Organized Health Care Education/Training Program

## 2024-04-23 VITALS — BP 128/67 | HR 65 | Ht 69.0 in | Wt 198.6 lb

## 2024-04-23 DIAGNOSIS — I493 Ventricular premature depolarization: Secondary | ICD-10-CM

## 2024-04-23 NOTE — Patient Instructions (Signed)
 Medication Instructions:  Your physician recommends that you continue on your current medications as directed. Please refer to the Current Medication list given to you today.  *If you need a refill on your cardiac medications before your next appointment, please call your pharmacy*  Lab Work: None ordered.  If you have labs (blood work) drawn today and your tests are completely normal, you will receive your results only by: MyChart Message (if you have MyChart) OR A paper copy in the mail If you have any lab test that is abnormal or we need to change your treatment, we will call you to review the results.  Testing/Procedures: None ordered.    Follow-Up: At [facility], you and your health needs are our priority.  As part of our continuing mission to provide you with exceptional heart care, our providers are all part of one team.  This team includes your primary Cardiologist (physician) and Advanced Practice Providers or APPs (Physician Assistants and Nurse Practitioners) who all work together to provide you with the care you need, when you need it.  Your next appointment:   6 months with Dr Almetta  Dr Almetta recommends you begin OTC Magnesium Glycinate
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
## 2024-04-23 NOTE — Progress Notes (Signed)
 Cardiology Office Note   Date: 04/21/24 ID:  [name], DOB [birth year], MRN [id number] PCP: Bernardo Fend, DO  [facility] Providers Cardiologist:  None Electrophysiologist:  Donnice DELENA Primus, MD   History of Present Illness Paula Page is a 43 y.o. female with symptomatic PVCs who presents for arrhythmia management.  History of Present Illness She has been experiencing premature ventricular contractions (PVCs) that have become less frequent recently. Initially, she became concerning around May of this year. In July, she visited the ER, where a referral to a cardiologist was supposed to be made but was not completed. The PVCs are more noticeable when lying on her side, particularly when she is supposed to be resting. The sensation is described as her heart pumping harder but with a low rate, sometimes feeling like she is running a marathon despite being at rest. She monitors her heart rate with a pulse oximeter and notes it can be in the 40s while resting.  She has a history of low vitamin D  and feels that the PVCs were less frequent after starting vitamin D  supplementation. She has not been on any specific medication for PVCs but has been considering magnesium supplements.   In her family history, she mentions an uncle who was a 'blue baby' and passed away in his thirties, and her mother who has had multiple strokes and TIAs due to small holes in her heart.  She works for [employer] in [place] and has been in this role for six years.  She previously experienced shoulder pain, nausea, stomach pain, and headache when she went to the ER, but does not currently report these symptoms.  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 Predominant underlying rhythm was Sinus Rhythm. 1 run of Ventricular Tachycardia occurred lasting 4 beats with a max rate of 140 bpm ( avg 133 bpm) . Isolated SVEs were rare ( < 1. 0% ) , SVE Couplets were rare ( < 1. 0% ) , and SVE Triplets were rare ( < 1. 0% ) . Isolated VEs were frequent ( 17. 1% , [id number]) , VE Couplets were rare ( < 1. 0% , 98) , and VE Triplets were rare ( < 1. 0% , 15) . Ventricular Bigeminy and Trigeminy were present.  Physical Exam VS:  BP 128/67   Pulse 65   Ht 5' 9 (1.753 m)   Wt 198 lb 9.6 oz (90.1 kg)   SpO2 99%   BMI 29.33 kg/m   Wt Readings from Last 3 Encounters:  04/23/24 198 lb 9.6 oz (90.1 kg)  04/01/24 194 lb 3.2 oz (88.1 kg)  02/03/24 197 lb (89.4 kg)    GEN: Well nourished, well developed in no acute distress NECK: No JVD; No carotid bruits CARDIAC: RRR, no murmurs, rubs, gallops RESPIRATORY:  Clear to auscultation without rales, wheezing or rhonchi EXTREMITIES:  No edema; No deformity   ASSESSMENT AND PLAN Paula Page is a 43 y.o. female with symptomatic PVCs who presents for arrhythmia management.  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 Prior 17% on OP monitor however none on ECG today. Symptoms nocturnal and stress-related. No heart dysfunction or significant arrhythmia. Ablation success rate would be low right now with minimal frequency compared to prior. Discussed risks of ablation and alternative treatments.  Explained different options for management including nodal blocking agents, antiarrhythmic such as flecainide, ablation, and supplements.  She would prefer the least invasive option with minimal side effects.  With a decline in frequency of PVCs more recently we can start with magnesium  supplementation.  If she continues to have significant burden then could consider adding Toprol-XL versus flecainide.  For now she would like to avoid medications if possible. - Start magnesium glycinate 360 mg daily. - Monitor symptoms and PVC frequency. - Consider ablation if PVC frequency increases significantly. - Schedule follow-up in six months.  A total of 40 minutes was spent preparing for the patient, reviewing history, performing exam, document encounter, coordinating care and counseling the patient. 28 minutes was spent with direct patient care.   Dispo: RTC 6 months   Signed, Donnice DELENA Primus, MD
